# Patient Record
Sex: Male | Born: 1985
Health system: Southern US, Community
[De-identification: ages and names within clinical notes are randomized; demographics above are authoritative.]

---
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
## 2003-08-10 ENCOUNTER — Emergency Department (HOSPITAL_COMMUNITY): Admission: AD | Admit: 2003-08-10 | Discharge: 2003-08-10 | Payer: Self-pay | Admitting: Emergency Medicine

## 2004-07-25 ENCOUNTER — Emergency Department (HOSPITAL_COMMUNITY): Admission: EM | Admit: 2004-07-25 | Discharge: 2004-07-25 | Payer: Self-pay | Admitting: Emergency Medicine

## 2008-03-05 ENCOUNTER — Emergency Department (HOSPITAL_COMMUNITY): Admission: EM | Admit: 2008-03-05 | Discharge: 2008-03-05 | Payer: Self-pay | Admitting: Emergency Medicine

## 2012-09-06 ENCOUNTER — Emergency Department: Payer: Self-pay | Admitting: Emergency Medicine

## 2018-03-17 ENCOUNTER — Emergency Department (HOSPITAL_COMMUNITY): Payer: Worker's Compensation

## 2018-03-17 ENCOUNTER — Inpatient Hospital Stay (HOSPITAL_COMMUNITY): Payer: Worker's Compensation | Admitting: Anesthesiology

## 2018-03-17 ENCOUNTER — Encounter (HOSPITAL_COMMUNITY): Payer: Self-pay | Admitting: Emergency Medicine

## 2018-03-17 ENCOUNTER — Inpatient Hospital Stay (HOSPITAL_COMMUNITY)
Admission: EM | Admit: 2018-03-17 | Discharge: 2018-03-21 | DRG: 958 | Disposition: A | Discharge status: Home / Self Care | Payer: Worker's Compensation | Attending: General Surgery | Admitting: General Surgery

## 2018-03-17 ENCOUNTER — Other Ambulatory Visit: Payer: Self-pay

## 2018-03-17 ENCOUNTER — Encounter (HOSPITAL_COMMUNITY): Admission: EM | Disposition: A | Discharge status: Home / Self Care | Payer: Self-pay | Source: Home / Self Care

## 2018-03-17 DIAGNOSIS — D62 Acute posthemorrhagic anemia: Secondary | ICD-10-CM | POA: Diagnosis present

## 2018-03-17 DIAGNOSIS — S0181XA Laceration without foreign body of other part of head, initial encounter: Secondary | ICD-10-CM | POA: Diagnosis present

## 2018-03-17 DIAGNOSIS — S36113A Laceration of liver, unspecified degree, initial encounter: Secondary | ICD-10-CM | POA: Diagnosis present

## 2018-03-17 DIAGNOSIS — Z419 Encounter for procedure for purposes other than remedying health state, unspecified: Secondary | ICD-10-CM

## 2018-03-17 DIAGNOSIS — S92001B Unspecified fracture of right calcaneus, initial encounter for open fracture: Secondary | ICD-10-CM | POA: Diagnosis present

## 2018-03-17 DIAGNOSIS — Y92410 Unspecified street and highway as the place of occurrence of the external cause: Secondary | ICD-10-CM

## 2018-03-17 DIAGNOSIS — F17200 Nicotine dependence, unspecified, uncomplicated: Secondary | ICD-10-CM | POA: Diagnosis present

## 2018-03-17 DIAGNOSIS — T148XXA Other injury of unspecified body region, initial encounter: Secondary | ICD-10-CM

## 2018-03-17 DIAGNOSIS — S62231A Other displaced fracture of base of first metacarpal bone, right hand, initial encounter for closed fracture: Secondary | ICD-10-CM | POA: Diagnosis present

## 2018-03-17 DIAGNOSIS — R0602 Shortness of breath: Secondary | ICD-10-CM

## 2018-03-17 DIAGNOSIS — S71112A Laceration without foreign body, left thigh, initial encounter: Secondary | ICD-10-CM | POA: Diagnosis present

## 2018-03-17 DIAGNOSIS — J939 Pneumothorax, unspecified: Secondary | ICD-10-CM

## 2018-03-17 DIAGNOSIS — S270XXA Traumatic pneumothorax, initial encounter: Secondary | ICD-10-CM | POA: Diagnosis present

## 2018-03-17 DIAGNOSIS — S92001A Unspecified fracture of right calcaneus, initial encounter for closed fracture: Secondary | ICD-10-CM

## 2018-03-17 HISTORY — PX: I & D EXTREMITY: SHX5045

## 2018-03-17 LAB — CBC
HEMATOCRIT: 45 % (ref 39.0–52.0)
HEMATOCRIT: 36.4 % — AB (ref 39.0–52.0)
HEMOGLOBIN: 15 g/dL (ref 13.0–17.0)
Hemoglobin: 12.3 g/dL — ABNORMAL LOW (ref 13.0–17.0)
MCH: 32.9 pg (ref 26.0–34.0)
MCH: 32.8 pg (ref 26.0–34.0)
MCHC: 33.3 g/dL (ref 30.0–36.0)
MCHC: 33.8 g/dL (ref 30.0–36.0)
MCV: 98.7 fL (ref 80.0–100.0)
MCV: 97.1 fL (ref 80.0–100.0)
NRBC: 0 % (ref 0.0–0.2)
NRBC: 0 % (ref 0.0–0.2)
PLATELETS: 168 10*3/uL (ref 150–400)
Platelets: 295 10*3/uL (ref 150–400)
RBC: 4.56 MIL/uL (ref 4.22–5.81)
RBC: 3.75 MIL/uL — ABNORMAL LOW (ref 4.22–5.81)
RDW: 12.4 % (ref 11.5–15.5)
RDW: 12.6 % (ref 11.5–15.5)
WBC: 24.8 10*3/uL — ABNORMAL HIGH (ref 4.0–10.5)
WBC: 10.9 10*3/uL — AB (ref 4.0–10.5)

## 2018-03-17 LAB — COMPREHENSIVE METABOLIC PANEL
ALBUMIN: 3.6 g/dL (ref 3.5–5.0)
ALK PHOS: 70 U/L (ref 38–126)
ALT: 173 U/L — ABNORMAL HIGH (ref 0–44)
ANION GAP: 10 (ref 5–15)
AST: 335 U/L — AB (ref 15–41)
BUN: 9 mg/dL (ref 6–20)
CO2: 20 mmol/L — ABNORMAL LOW (ref 22–32)
Calcium: 8.4 mg/dL — ABNORMAL LOW (ref 8.9–10.3)
Chloride: 109 mmol/L (ref 98–111)
Creatinine, Ser: 1.18 mg/dL (ref 0.61–1.24)
GFR calc Af Amer: >60 mL/min (ref >60)
GFR calc non Af Amer: >60 mL/min (ref >60)
GLUCOSE: 142 mg/dL — AB (ref 70–99)
POTASSIUM: 4.1 mmol/L (ref 3.5–5.1)
SODIUM: 139 mmol/L (ref 135–145)
Total Bilirubin: 0.9 mg/dL (ref 0.3–1.2)
Total Protein: 6.6 g/dL (ref 6.5–8.1)

## 2018-03-17 LAB — I-STAT CG4 LACTIC ACID, ED: Lactic Acid, Venous: 3.6 mmol/L (ref 0.5–1.9)

## 2018-03-17 LAB — ETHANOL

## 2018-03-17 LAB — CDS SEROLOGY

## 2018-03-17 LAB — PROTIME-INR
INR: 0.82
PROTHROMBIN TIME: 11.2 s — AB (ref 11.4–15.2)

## 2018-03-17 LAB — CBG MONITORING, ED: Glucose-Capillary: 135 mg/dL — ABNORMAL HIGH (ref 70–99)

## 2018-03-17 SURGERY — IRRIGATION AND DEBRIDEMENT EXTREMITY
Anesthesia: General | Site: Foot | Laterality: Right

## 2018-03-17 MED ORDER — SODIUM CHLORIDE 0.9 % IR SOLN
Status: DC | PRN
  Administered 2018-03-17: 6000 mL

## 2018-03-17 MED ORDER — FENTANYL CITRATE (PF) 100 MCG/2ML IJ SOLN
INTRAMUSCULAR | Status: AC | PRN
  Administered 2018-03-17: 100 ug via INTRAVENOUS

## 2018-03-17 MED ORDER — PROPOFOL 10 MG/ML IV BOLUS
INTRAVENOUS | Status: AC
  Filled 2018-03-17: qty 20

## 2018-03-17 MED ORDER — CEFAZOLIN SODIUM-DEXTROSE 2-4 GM/100ML-% IV SOLN
2.0000 g | Freq: Once | INTRAVENOUS | Status: AC
  Administered 2018-03-17: 2 g via INTRAVENOUS
  Filled 2018-03-17: qty 100

## 2018-03-17 MED ORDER — HYDRALAZINE HCL 20 MG/ML IJ SOLN: 10.0000 mg | INTRAMUSCULAR | Status: DC | PRN

## 2018-03-17 MED ORDER — FENTANYL CITRATE (PF) 250 MCG/5ML IJ SOLN
INTRAMUSCULAR | Status: AC
  Filled 2018-03-17: qty 5

## 2018-03-17 MED ORDER — MEPERIDINE HCL 50 MG/ML IJ SOLN: 6.2500 mg | INTRAMUSCULAR | Status: DC | PRN

## 2018-03-17 MED ORDER — IOHEXOL 300 MG/ML  SOLN
100.0000 mL | Freq: Once | INTRAMUSCULAR | Status: AC | PRN
  Administered 2018-03-17: 100 mL via INTRAVENOUS

## 2018-03-17 MED ORDER — LIDOCAINE-EPINEPHRINE (PF) 2 %-1:200000 IJ SOLN
20.0000 mL | Freq: Once | INTRAMUSCULAR | Status: DC
  Filled 2018-03-17: qty 20

## 2018-03-17 MED ORDER — LACTATED RINGERS IV SOLN: INTRAVENOUS | Status: DC

## 2018-03-17 MED ORDER — ONDANSETRON HCL 4 MG PO TABS: 4.0000 mg | ORAL_TABLET | Freq: Four times a day (QID) | ORAL | Status: DC | PRN

## 2018-03-17 MED ORDER — CEFAZOLIN SODIUM-DEXTROSE 1-4 GM/50ML-% IV SOLN
1.0000 g | Freq: Four times a day (QID) | INTRAVENOUS | Status: AC
  Administered 2018-03-17 – 2018-03-18 (×3): 1 g via INTRAVENOUS
  Filled 2018-03-17 (×3): qty 50

## 2018-03-17 MED ORDER — ACETAMINOPHEN 325 MG PO TABS
650.0000 mg | ORAL_TABLET | Freq: Four times a day (QID) | ORAL | Status: DC
  Administered 2018-03-17 – 2018-03-21 (×14): 650 mg via ORAL
  Filled 2018-03-17 (×14): qty 2

## 2018-03-17 MED ORDER — LIDOCAINE 2% (20 MG/ML) 5 ML SYRINGE
INTRAMUSCULAR | Status: DC | PRN
  Administered 2018-03-17: 100 mg via INTRAVENOUS

## 2018-03-17 MED ORDER — SODIUM CHLORIDE 0.9 % IV SOLN
INTRAVENOUS | Status: DC
  Administered 2018-03-17 – 2018-03-21 (×9): via INTRAVENOUS

## 2018-03-17 MED ORDER — HYDROMORPHONE HCL 1 MG/ML IJ SOLN
0.2500 mg | INTRAMUSCULAR | Status: DC | PRN
  Administered 2018-03-17 (×2): 0.25 mg via INTRAVENOUS
  Administered 2018-03-17: 0.5 mg via INTRAVENOUS

## 2018-03-17 MED ORDER — METOCLOPRAMIDE HCL 5 MG/ML IJ SOLN: 5.0000 mg | Freq: Three times a day (TID) | INTRAMUSCULAR | Status: DC | PRN

## 2018-03-17 MED ORDER — ONDANSETRON HCL 4 MG/2ML IJ SOLN
4.0000 mg | Freq: Four times a day (QID) | INTRAMUSCULAR | Status: DC | PRN
  Administered 2018-03-17 – 2018-03-19 (×3): 4 mg via INTRAVENOUS
  Filled 2018-03-17 (×2): qty 2

## 2018-03-17 MED ORDER — SUCCINYLCHOLINE CHLORIDE 20 MG/ML IJ SOLN
INTRAMUSCULAR | Status: DC | PRN
  Administered 2018-03-17: 120 mg via INTRAVENOUS

## 2018-03-17 MED ORDER — FENTANYL CITRATE (PF) 100 MCG/2ML IJ SOLN
INTRAMUSCULAR | Status: AC
  Filled 2018-03-17: qty 2

## 2018-03-17 MED ORDER — HYDROMORPHONE HCL 1 MG/ML IJ SOLN
1.0000 mg | Freq: Once | INTRAMUSCULAR | Status: AC
  Administered 2018-03-17: 1 mg via INTRAVENOUS
  Filled 2018-03-17: qty 1

## 2018-03-17 MED ORDER — TETANUS-DIPHTH-ACELL PERTUSSIS 5-2.5-18.5 LF-MCG/0.5 IM SUSP
0.5000 mL | Freq: Once | INTRAMUSCULAR | Status: AC
  Administered 2018-03-17: 0.5 mL via INTRAMUSCULAR
  Filled 2018-03-17: qty 0.5

## 2018-03-17 MED ORDER — LIDOCAINE-EPINEPHRINE (PF) 1 %-1:200000 IJ SOLN
20.0000 mL | Freq: Once | INTRAMUSCULAR | Status: AC
  Administered 2018-03-17: 20 mL via INTRADERMAL

## 2018-03-17 MED ORDER — DIPHENHYDRAMINE HCL 50 MG/ML IJ SOLN
INTRAMUSCULAR | Status: DC | PRN
  Administered 2018-03-17: 12.5 mg via INTRAVENOUS

## 2018-03-17 MED ORDER — CEFAZOLIN SODIUM-DEXTROSE 2-3 GM-%(50ML) IV SOLR
INTRAVENOUS | Status: DC | PRN
  Administered 2018-03-17: 2 g via INTRAVENOUS

## 2018-03-17 MED ORDER — MIDAZOLAM HCL 2 MG/2ML IJ SOLN
INTRAMUSCULAR | Status: AC
  Filled 2018-03-17: qty 2

## 2018-03-17 MED ORDER — NICOTINE 14 MG/24HR TD PT24
14.0000 mg | MEDICATED_PATCH | Freq: Every day | TRANSDERMAL | Status: DC
  Administered 2018-03-17 – 2018-03-21 (×5): 14 mg via TRANSDERMAL
  Filled 2018-03-17 (×5): qty 1

## 2018-03-17 MED ORDER — MIDAZOLAM HCL 5 MG/5ML IJ SOLN
INTRAMUSCULAR | Status: DC | PRN
  Administered 2018-03-17: 2 mg via INTRAVENOUS

## 2018-03-17 MED ORDER — DOCUSATE SODIUM 100 MG PO CAPS: 100.0000 mg | ORAL_CAPSULE | Freq: Two times a day (BID) | ORAL | Status: DC

## 2018-03-17 MED ORDER — PHENYLEPHRINE HCL 10 MG/ML IJ SOLN
INTRAMUSCULAR | Status: DC | PRN
  Administered 2018-03-17: 40 ug via INTRAVENOUS

## 2018-03-17 MED ORDER — LACTATED RINGERS IV SOLN
INTRAVENOUS | Status: DC | PRN
  Administered 2018-03-17 (×2): via INTRAVENOUS

## 2018-03-17 MED ORDER — ONDANSETRON HCL 4 MG/2ML IJ SOLN: 4.0000 mg | Freq: Four times a day (QID) | INTRAMUSCULAR | Status: DC | PRN

## 2018-03-17 MED ORDER — PROMETHAZINE HCL 25 MG/ML IJ SOLN: 6.2500 mg | INTRAMUSCULAR | Status: DC | PRN

## 2018-03-17 MED ORDER — OXYCODONE HCL 5 MG PO TABS
10.0000 mg | ORAL_TABLET | ORAL | Status: DC | PRN
  Administered 2018-03-18 – 2018-03-21 (×13): 10 mg via ORAL
  Filled 2018-03-17 (×13): qty 2

## 2018-03-17 MED ORDER — DOCUSATE SODIUM 100 MG PO CAPS
100.0000 mg | ORAL_CAPSULE | Freq: Two times a day (BID) | ORAL | Status: DC
  Administered 2018-03-17 – 2018-03-21 (×6): 100 mg via ORAL
  Filled 2018-03-17 (×6): qty 1

## 2018-03-17 MED ORDER — FENTANYL CITRATE (PF) 250 MCG/5ML IJ SOLN
INTRAMUSCULAR | Status: DC | PRN
  Administered 2018-03-17: 100 ug via INTRAVENOUS
  Administered 2018-03-17: 50 ug via INTRAVENOUS
  Administered 2018-03-17: 100 ug via INTRAVENOUS

## 2018-03-17 MED ORDER — METOCLOPRAMIDE HCL 10 MG PO TABS: 5.0000 mg | ORAL_TABLET | Freq: Three times a day (TID) | ORAL | Status: DC | PRN

## 2018-03-17 MED ORDER — SODIUM CHLORIDE 0.9 % IV SOLN
INTRAVENOUS | Status: AC | PRN
  Administered 2018-03-17: 1000 mL via INTRAVENOUS

## 2018-03-17 MED ORDER — MORPHINE SULFATE (PF) 2 MG/ML IV SOLN
2.0000 mg | INTRAVENOUS | Status: DC | PRN
  Administered 2018-03-17: 2 mg via INTRAVENOUS
  Administered 2018-03-17 – 2018-03-18 (×5): 4 mg via INTRAVENOUS
  Administered 2018-03-20: 2 mg via INTRAVENOUS
  Filled 2018-03-17 (×4): qty 2
  Filled 2018-03-17 (×2): qty 1
  Filled 2018-03-17: qty 2

## 2018-03-17 MED ORDER — HYDROMORPHONE HCL 1 MG/ML IJ SOLN
INTRAMUSCULAR | Status: AC
  Administered 2018-03-17: 0.5 mg via INTRAVENOUS
  Filled 2018-03-17: qty 1

## 2018-03-17 MED ORDER — FAMOTIDINE 20 MG PO TABS
20.0000 mg | ORAL_TABLET | Freq: Every day | ORAL | Status: DC
  Administered 2018-03-18 – 2018-03-21 (×3): 20 mg via ORAL
  Filled 2018-03-17 (×3): qty 1

## 2018-03-17 MED ORDER — ONDANSETRON HCL 4 MG/2ML IJ SOLN
INTRAMUSCULAR | Status: DC | PRN
  Administered 2018-03-17: 4 mg via INTRAVENOUS

## 2018-03-17 MED ORDER — ONDANSETRON 4 MG PO TBDP: 4.0000 mg | ORAL_TABLET | Freq: Four times a day (QID) | ORAL | Status: DC | PRN

## 2018-03-17 MED ORDER — LIDOCAINE-EPINEPHRINE (PF) 2 %-1:200000 IJ SOLN
20.0000 mL | Freq: Once | INTRAMUSCULAR | Status: AC
  Administered 2018-03-17: 20 mL via INTRADERMAL
  Filled 2018-03-17: qty 20

## 2018-03-17 MED ORDER — PROPOFOL 10 MG/ML IV BOLUS
INTRAVENOUS | Status: DC | PRN
  Administered 2018-03-17: 160 mg via INTRAVENOUS

## 2018-03-17 SURGICAL SUPPLY — 71 items
ALCOHOL 70% 16 OZ (MISCELLANEOUS) ×3 IMPLANT
BANDAGE ACE 3X5.8 VEL STRL LF (GAUZE/BANDAGES/DRESSINGS) IMPLANT
BANDAGE ELASTIC 4 VELCRO ST LF (GAUZE/BANDAGES/DRESSINGS) ×2 IMPLANT
BANDAGE ELASTIC 6 VELCRO ST LF (GAUZE/BANDAGES/DRESSINGS) ×2 IMPLANT
BLADE SURG 10 STRL SS (BLADE) ×3 IMPLANT
BNDG COHESIVE 1X5 TAN STRL LF (GAUZE/BANDAGES/DRESSINGS) IMPLANT
BNDG COHESIVE 4X5 TAN STRL (GAUZE/BANDAGES/DRESSINGS) ×3 IMPLANT
BNDG COHESIVE 6X5 TAN STRL LF (GAUZE/BANDAGES/DRESSINGS) ×6 IMPLANT
BNDG CONFORM 3 STRL LF (GAUZE/BANDAGES/DRESSINGS) IMPLANT
BNDG GAUZE STRTCH 6 (GAUZE/BANDAGES/DRESSINGS) ×9 IMPLANT
CORDS BIPOLAR (ELECTRODE) IMPLANT
COVER SURGICAL LIGHT HANDLE (MISCELLANEOUS) ×3 IMPLANT
COVER WAND RF STERILE (DRAPES) ×3 IMPLANT
CUFF TOURNIQUET SINGLE 24IN (TOURNIQUET CUFF) IMPLANT
CUFF TOURNIQUET SINGLE 34IN LL (TOURNIQUET CUFF) ×6 IMPLANT
CUFF TOURNIQUET SINGLE 44IN (TOURNIQUET CUFF) IMPLANT
DRAPE EXTREMITY BILATERAL (DRAPES) IMPLANT
DRAPE IMP U-DRAPE 54X76 (DRAPES) IMPLANT
DRAPE INCISE IOBAN 66X45 STRL (DRAPES) ×12 IMPLANT
DRAPE SURG 17X23 STRL (DRAPES) IMPLANT
DRAPE U-SHAPE 47X51 STRL (DRAPES) ×3 IMPLANT
DURAPREP 26ML APPLICATOR (WOUND CARE) ×3 IMPLANT
ELECT CAUTERY BLADE 6.4 (BLADE) ×3 IMPLANT
ELECT REM PT RETURN 9FT ADLT (ELECTROSURGICAL)
ELECTRODE REM PT RTRN 9FT ADLT (ELECTROSURGICAL) IMPLANT
FACESHIELD WRAPAROUND (MASK) IMPLANT
FACESHIELD WRAPAROUND OR TEAM (MASK) IMPLANT
GAUZE SPONGE 4X4 12PLY STRL (GAUZE/BANDAGES/DRESSINGS) ×6 IMPLANT
GAUZE XEROFORM 1X8 LF (GAUZE/BANDAGES/DRESSINGS) ×3 IMPLANT
GAUZE XEROFORM 5X9 LF (GAUZE/BANDAGES/DRESSINGS) ×3 IMPLANT
GLOVE BIO SURGEON STRL SZ7.5 (GLOVE) ×3 IMPLANT
GLOVE BIOGEL PI IND STRL 8 (GLOVE) ×1 IMPLANT
GLOVE BIOGEL PI INDICATOR 8 (GLOVE) ×2
GOWN STRL REUS W/ TWL LRG LVL3 (GOWN DISPOSABLE) ×2 IMPLANT
GOWN STRL REUS W/ TWL XL LVL3 (GOWN DISPOSABLE) ×1 IMPLANT
GOWN STRL REUS W/TWL LRG LVL3 (GOWN DISPOSABLE) ×6
GOWN STRL REUS W/TWL XL LVL3 (GOWN DISPOSABLE) ×3
HANDPIECE INTERPULSE COAX TIP (DISPOSABLE)
KIT BASIN OR (CUSTOM PROCEDURE TRAY) ×3 IMPLANT
KIT TURNOVER KIT B (KITS) ×3 IMPLANT
MANIFOLD NEPTUNE II (INSTRUMENTS) ×3 IMPLANT
NS IRRIG 1000ML POUR BTL (IV SOLUTION) ×6 IMPLANT
PACK ORTHO EXTREMITY (CUSTOM PROCEDURE TRAY) ×3 IMPLANT
PAD ABD 8X10 STRL (GAUZE/BANDAGES/DRESSINGS) ×3 IMPLANT
PAD ARMBOARD 7.5X6 YLW CONV (MISCELLANEOUS) ×6 IMPLANT
PADDING CAST ABS 4INX4YD NS (CAST SUPPLIES) ×4
PADDING CAST ABS 6INX4YD NS (CAST SUPPLIES) ×2
PADDING CAST ABS COTTON 4X4 ST (CAST SUPPLIES) ×2 IMPLANT
PADDING CAST ABS COTTON 6X4 NS (CAST SUPPLIES) IMPLANT
PADDING CAST COTTON 6X4 STRL (CAST SUPPLIES) ×3 IMPLANT
SET HNDPC FAN SPRY TIP SCT (DISPOSABLE) IMPLANT
SPLINT PLASTER CAST XFAST 5X30 (CAST SUPPLIES) IMPLANT
SPLINT PLASTER XFAST SET 5X30 (CAST SUPPLIES) ×2
SPONGE LAP 18X18 X RAY DECT (DISPOSABLE) ×6 IMPLANT
STOCKINETTE IMPERVIOUS 9X36 MD (GAUZE/BANDAGES/DRESSINGS) ×3 IMPLANT
SUT ETHILON 2 0 FS 18 (SUTURE) IMPLANT
SUT ETHILON 2 0 PSLX (SUTURE) IMPLANT
SUT ETHILON 3 0 PS 1 (SUTURE) IMPLANT
SUT VIC AB 2-0 CT1 36 (SUTURE) IMPLANT
SUT VIC AB 2-0 FS1 27 (SUTURE) IMPLANT
SWAB CULTURE ESWAB REG 1ML (MISCELLANEOUS) IMPLANT
SYR CONTROL 10ML LL (SYRINGE) IMPLANT
TOWEL OR 17X24 6PK STRL BLUE (TOWEL DISPOSABLE) ×3 IMPLANT
TOWEL OR 17X26 10 PK STRL BLUE (TOWEL DISPOSABLE) ×3 IMPLANT
TUBE CONNECTING 12'X1/4 (SUCTIONS) ×1
TUBE CONNECTING 12X1/4 (SUCTIONS) ×2 IMPLANT
TUBE FEEDING ENTERAL 5FR 16IN (TUBING) IMPLANT
TUBING CYSTO DISP (UROLOGICAL SUPPLIES) ×3 IMPLANT
UNDERPAD 30X30 (UNDERPADS AND DIAPERS) ×6 IMPLANT
WATER STERILE IRR 1000ML POUR (IV SOLUTION) ×3 IMPLANT
YANKAUER SUCT BULB TIP NO VENT (SUCTIONS) ×3 IMPLANT

## 2018-03-17 NOTE — Brief Op Note (Signed)
03/17/2018  7:42 PM  PATIENT:  Dustin [name]  32 y.o. male  PRE-OPERATIVE DIAGNOSIS:  Open right calc fx  POST-OPERATIVE DIAGNOSIS:  Open right calc fx  PROCEDURE:  Procedure(s): IRRIGATION AND DEBRIDEMENT EXTREMITY (Right)  SURGEON:  Surgeon(s) and Role:    * Yolonda Kida, MD - Primary  PHYSICIAN ASSISTANT:   ASSISTANTS: none   ANESTHESIA:   general  EBL:  10 cc  BLOOD ADMINISTERED:none  DRAINS: none   LOCAL MEDICATIONS USED:  NONE  SPECIMEN:  No Specimen  DISPOSITION OF SPECIMEN:  N/A  COUNTS:  YES  TOURNIQUET:  * No tourniquets in log *  DICTATION: .Note written in EPIC  PLAN OF CARE: Admit to inpatient   PATIENT DISPOSITION:  PACU - hemodynamically stable.   Delay start of Pharmacological VTE agent (>24hrs) due to surgical blood loss or risk of bleeding: not applicable
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
## 2018-03-17 NOTE — ED Notes (Signed)
Attempted report x1. 

## 2018-03-17 NOTE — ED Notes (Signed)
Patient transported to CT 
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
## 2018-03-17 NOTE — ED Notes (Signed)
Wife at bedside, updated on plan of care.
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
## 2018-03-17 NOTE — Anesthesia Postprocedure Evaluation (Signed)
Anesthesia Post Note  Patient: Betsy Coder  Procedure(s) Performed: IRRIGATION AND DEBRIDEMENT EXTREMITY (Right Foot)     Patient location during evaluation: PACU Anesthesia Type: General Level of consciousness: awake Pain management: pain level controlled Vital Signs Assessment: post-procedure vital signs reviewed and stable Respiratory status: spontaneous breathing Cardiovascular status: stable Postop Assessment: no apparent nausea or vomiting Anesthetic complications: no    Last Vitals:  Vitals:   03/17/18 2040 03/17/18 2110  BP: 135/83 (!) 147/92  Pulse: 99 98  Resp: 19   Temp: 36.6 C 37.3 C  SpO2: 92% 94%    Last Pain:  Vitals:   03/17/18 2218  TempSrc:   PainSc: 9     LLE Motor Response: Purposeful movement;Responds to commands (03/17/18 2249) LLE Sensation: Full sensation (03/17/18 2249) RLE Motor Response: Purposeful movement;Responds to commands (03/17/18 2249) RLE Sensation: Full sensation;Pain (03/17/18 2249)      Dustin [name]

## 2018-03-17 NOTE — ED Notes (Signed)
RN noticed 3-4in lac on back of pts left thigh. Dr. Patria Mane notified.

## 2018-03-17 NOTE — Progress Notes (Signed)
Orthopedic Tech Progress Note Patient Details:  Dustin [name] 06-06-1985 [id number]  Ortho Devices Type of Ortho Device: Ace wrap, Thumb spica splint Splint Material: Plaster Ortho Device/Splint Location: rue Ortho Device/Splint Interventions: Application   Post Interventions Patient Tolerated: Well Instructions Provided: Care of device   Nikki Dom 03/17/2018, 1:31 PM

## 2018-03-17 NOTE — Op Note (Signed)
Date of Surgery: 03/17/2018  INDICATIONS: Dustin [name] is a 31 y.o.-year-old male with a right open calcaneus fracture;  The patient did consent to the procedure after discussion of the risks and benefits.  PREOPERATIVE DIAGNOSIS: Right type I open calcaneus fracture  POSTOPERATIVE DIAGNOSIS: Same.  PROCEDURE: Irrigation and excisional debridement for right type I open calcaneus fracture including bone, skin, subcutaneous tissue, and muscle.  SURGEON: Maryan Rued, M.D.  ASSIST: None.  ANESTHESIA:  general  IV FLUIDS AND URINE: See anesthesia.  ESTIMATED BLOOD LOSS: 10 mL.  IMPLANTS: None  DRAINS: None  COMPLICATIONS: None.  DESCRIPTION OF PROCEDURE: The patient was brought to the operating room and placed supine on the operating table.  The patient had been signed prior to the procedure and this was documented. The patient had the anesthesia placed by the anesthesiologist.  A time-out was performed to confirm that this was the correct patient, site, side and location. The patient did receive antibiotics prior to the incision and was re-dosed during the procedure as needed at indicated intervals.  A tourniquet was not placed.  The patient had the operative extremity prepped and draped in the standard surgical fashion.      There was a 1.5 cm opening noted just distal and posterior to the distal tip of the fibula.  This communicated with the calcaneus fracture.  I extended this in line with the long axis of the foot distal fashion approximately 5 cm.  We identified the sural nerve and the wound and protected this throughout the case.  We then dissected down through subcutaneous tissue to the comminuted fracture.  We then excisionally debrided skin, subcutaneous tissue, muscle, and bone sharply using a knife and rondure.  Following the excisional debridement we then copiously irrigated wound with 6 L of normal saline.  The wound was then closed primarily utilizing horizontal mattress 2-0  nylon sutures.  A standard sterile dressing was applied.  The leg was cleaned and dried one final time.  All counts were correct x2.  We then placed the right lower extremity in a well-padded short leg posterior splint with a stirrup.  Patient was transported to PACU in stable condition.  POSTOPERATIVE PLAN:  Dustin [name] will be admitted to the trauma service.  He will remain nonweightbearing to the right lower extremity with strict elevation.  He will ultimately need definitive fixation of both the calcaneus and the right thumb.  The orthopedic trauma service will be reviewing his films and likely proceed with definitive fixation when they have availability.  They will be seeing the patient in consultation tomorrow.  Okay to begin chemical DVT prophylaxis as soon as possible.

## 2018-03-17 NOTE — Anesthesia Procedure Notes (Signed)
Procedure Name: Intubation Date/Time: 03/17/2018 7:06 PM Performed by: Claudina Lick, CRNA Pre-anesthesia Checklist: Patient identified, Emergency Drugs available, Suction available, Patient being monitored and Timeout performed Patient Re-evaluated:Patient Re-evaluated prior to induction Oxygen Delivery Method: Circle system utilized Preoxygenation: Pre-oxygenation with 100% oxygen Induction Type: IV induction, Rapid sequence and Cricoid Pressure applied Laryngoscope Size: Miller and 2 Grade View: Grade I Tube size: 7.5 mm Number of attempts: 1 Airway Equipment and Method: Stylet Placement Confirmation: ETT inserted through vocal cords under direct vision,  positive ETCO2 and breath sounds checked- equal and bilateral Secured at: 22 cm Tube secured with: Tape Dental Injury: Teeth and Oropharynx as per pre-operative assessment

## 2018-03-17 NOTE — Transfer of Care (Signed)
Immediate Anesthesia Transfer of Care Note  Patient: Dustin [name]  Procedure(s) Performed: IRRIGATION AND DEBRIDEMENT EXTREMITY (Right Foot)  Patient Location: PACU  Anesthesia Type:General  Level of Consciousness: drowsy  Airway & Oxygen Therapy: Patient Spontanous Breathing and Patient connected to face mask oxygen  Post-op Assessment: Report given to RN and Post -op Vital signs reviewed and stable  Post vital signs: Reviewed and stable  Last Vitals:  Vitals Value Taken Time  BP 130/88 03/17/2018  7:46 PM  Temp 36.5 C 03/17/2018  7:46 PM  Pulse 93 03/17/2018  7:48 PM  Resp 25 03/17/2018  7:48 PM  SpO2 93 % 03/17/2018  7:48 PM  Vitals shown include unvalidated device data.  Last Pain:  Vitals:   03/17/18 1525  TempSrc:   PainSc: 8       Patients Stated Pain Goal: 0 (03/17/18 1302)  Complications: No apparent anesthesia complications

## 2018-03-17 NOTE — Procedures (Signed)
SUBJECTIVE:  32 y.o. male sustained laceration of left chin just inferior to lip.   OBJECTIVE:  Patient appears well, vitals are normal. Laceration 4 cm noted.  Description: clean wound edges, no foreign bodies. Neurovascular and tendon structures are intact.  ASSESSMENT:  Laceration as described.  Procedure: laceration repair  Beard around wound was shaved. Sterile technique was maintained throughout entire procedure. Wound cleaned with betadine and covered with sterile drape. Anesthesia with 1% Lidocaine with Epinephrine was injected into wound. Wound cleansed, debrided of visible foreign material and sutured. 5.0 prolene was used and 4 sutures were placed. Dressing applied.  Wound care instructions provided.  Pt tolerated procedure well. Minimal blood loss.  Mattie Marlin, [facility] Surgery Pager [phone]

## 2018-03-17 NOTE — ED Notes (Signed)
Gave results to campos md

## 2018-03-17 NOTE — Anesthesia Preprocedure Evaluation (Addendum)
Anesthesia Evaluation  Patient identified by MRN, date of birth, ID band Patient awake    Reviewed: Allergy & Precautions, NPO status , Patient's Chart, lab work & pertinent test results  Airway Mallampati: II  TM Distance: >3 FB Neck ROM: Full    Dental no notable dental hx.    Pulmonary Current Smoker,    Pulmonary exam normal breath sounds clear to auscultation       Cardiovascular negative cardio ROS Normal cardiovascular exam Rhythm:Regular Rate:Normal     Neuro/Psych negative neurological ROS  negative psych ROS   GI/Hepatic negative GI ROS, Neg liver ROS,   Endo/Other  negative endocrine ROS  Renal/GU negative Renal ROS     Musculoskeletal negative musculoskeletal ROS (+)   Abdominal   Peds  Hematology negative hematology ROS (+)   Anesthesia Other Findings   Reproductive/Obstetrics                             Anesthesia Physical Anesthesia Plan  ASA: II  Anesthesia Plan: General   Post-op Pain Management:    Induction: Intravenous  PONV Risk Score and Plan: 2 and Ondansetron, Dexamethasone and Midazolam  Airway Management Planned: Oral ETT  Additional Equipment:   Intra-op Plan:   Post-operative Plan: Extubation in OR  Informed Consent: I have reviewed the patients History and Physical, chart, labs and discussed the procedure including the risks, benefits and alternatives for the proposed anesthesia with the patient or authorized representative who has indicated his/her understanding and acceptance.   Dental advisory given  Plan Discussed with: CRNA  Anesthesia Plan Comments:        Anesthesia Quick Evaluation
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
## 2018-03-18 ENCOUNTER — Encounter (HOSPITAL_COMMUNITY): Payer: Self-pay | Admitting: Orthopedic Surgery

## 2018-03-18 ENCOUNTER — Inpatient Hospital Stay (HOSPITAL_COMMUNITY): Payer: Worker's Compensation

## 2018-03-18 LAB — RAPID URINE DRUG SCREEN, HOSP PERFORMED
AMPHETAMINES: NOT DETECTED
BENZODIAZEPINES: POSITIVE — AB
Barbiturates: NOT DETECTED
Cocaine: NOT DETECTED
OPIATES: POSITIVE — AB
Tetrahydrocannabinol: NOT DETECTED

## 2018-03-18 LAB — CBC
HCT: 37.1 % — ABNORMAL LOW (ref 39.0–52.0)
Hemoglobin: 11.9 g/dL — ABNORMAL LOW (ref 13.0–17.0)
MCH: 31.9 pg (ref 26.0–34.0)
MCHC: 32.1 g/dL (ref 30.0–36.0)
MCV: 99.5 fL (ref 80.0–100.0)
NRBC: 0 % (ref 0.0–0.2)
PLATELETS: 157 10*3/uL (ref 150–400)
RBC: 3.73 MIL/uL — ABNORMAL LOW (ref 4.22–5.81)
RDW: 12.8 % (ref 11.5–15.5)
WBC: 11.3 10*3/uL — AB (ref 4.0–10.5)

## 2018-03-18 LAB — MRSA PCR SCREENING: MRSA by PCR: NEGATIVE

## 2018-03-18 LAB — HIV ANTIBODY (ROUTINE TESTING W REFLEX): HIV Screen 4th Generation wRfx: NONREACTIVE

## 2018-03-18 MED ORDER — ENOXAPARIN SODIUM 40 MG/0.4ML ~~LOC~~ SOLN
40.0000 mg | SUBCUTANEOUS | Status: DC
  Administered 2018-03-18 – 2018-03-21 (×3): 40 mg via SUBCUTANEOUS
  Filled 2018-03-18 (×3): qty 0.4
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
## 2018-03-18 NOTE — Anesthesia Preprocedure Evaluation (Addendum)
Anesthesia Evaluation  Patient identified by MRN, date of birth, ID band Patient awake    Reviewed: Allergy & Precautions, H&P , NPO status , Patient's Chart, lab work & pertinent test results  Airway Mallampati: II  TM Distance: >3 FB Neck ROM: Full    Dental no notable dental hx. (+) Poor Dentition, Missing, Loose,    Pulmonary neg pulmonary ROS, Current Smoker,    Pulmonary exam normal breath sounds clear to auscultation + decreased breath sounds      Cardiovascular Exercise Tolerance: Good negative cardio ROS Normal cardiovascular exam Rhythm:Regular Rate:Normal     Neuro/Psych negative neurological ROS  negative psych ROS   GI/Hepatic negative GI ROS, Neg liver ROS,   Endo/Other  negative endocrine ROS  Renal/GU negative Renal ROS  negative genitourinary   Musculoskeletal Fx right 1st metacarpal Fx right calcaneus   Abdominal   Peds  Hematology negative hematology ROS (+)   Anesthesia Other Findings   Reproductive/Obstetrics negative OB ROS                        Anesthesia Physical Anesthesia Plan  ASA: II  Anesthesia Plan: General   Post-op Pain Management:    Induction: Intravenous  PONV Risk Score and Plan: 2 and Ondansetron, Dexamethasone and Midazolam  Airway Management Planned: Oral ETT  Additional Equipment:   Intra-op Plan:   Post-operative Plan: Extubation in OR  Informed Consent: I have reviewed the patients History and Physical, chart, labs and discussed the procedure including the risks, benefits and alternatives for the proposed anesthesia with the patient or authorized representative who has indicated his/her understanding and acceptance.   Dental advisory given  Plan Discussed with: CRNA and Surgeon  Anesthesia Plan Comments:        Anesthesia Quick Evaluation
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
## 2018-03-19 ENCOUNTER — Encounter (HOSPITAL_COMMUNITY): Admission: EM | Disposition: A | Discharge status: Home / Self Care | Payer: Self-pay | Source: Home / Self Care

## 2018-03-19 ENCOUNTER — Inpatient Hospital Stay (HOSPITAL_COMMUNITY): Payer: Worker's Compensation

## 2018-03-19 ENCOUNTER — Inpatient Hospital Stay (HOSPITAL_COMMUNITY): Payer: Worker's Compensation | Admitting: Anesthesiology

## 2018-03-19 ENCOUNTER — Encounter (HOSPITAL_COMMUNITY): Payer: Self-pay | Admitting: Anesthesiology

## 2018-03-19 HISTORY — PX: CHEST TUBE INSERTION: SHX231

## 2018-03-19 HISTORY — PX: I & D EXTREMITY: SHX5045

## 2018-03-19 HISTORY — PX: OPEN REDUCTION INTERNAL FIXATION (ORIF) METACARPAL: SHX6234

## 2018-03-19 LAB — BASIC METABOLIC PANEL
ANION GAP: 7 (ref 5–15)
BUN: <5 mg/dL — ABNORMAL LOW (ref 6–20)
CO2: 23 mmol/L (ref 22–32)
Calcium: 7.9 mg/dL — ABNORMAL LOW (ref 8.9–10.3)
Chloride: 107 mmol/L (ref 98–111)
Creatinine, Ser: 0.98 mg/dL (ref 0.61–1.24)
GFR calc Af Amer: >60 mL/min (ref >60)
GLUCOSE: 98 mg/dL (ref 70–99)
POTASSIUM: 3.7 mmol/L (ref 3.5–5.1)
Sodium: 137 mmol/L (ref 135–145)

## 2018-03-19 LAB — CBC
HEMATOCRIT: 32.3 % — AB (ref 39.0–52.0)
Hemoglobin: 10.6 g/dL — ABNORMAL LOW (ref 13.0–17.0)
MCH: 32.6 pg (ref 26.0–34.0)
MCHC: 32.8 g/dL (ref 30.0–36.0)
MCV: 99.4 fL (ref 80.0–100.0)
NRBC: 0 % (ref 0.0–0.2)
PLATELETS: 154 10*3/uL (ref 150–400)
RBC: 3.25 MIL/uL — AB (ref 4.22–5.81)
RDW: 12.3 % (ref 11.5–15.5)
WBC: 14.4 10*3/uL — ABNORMAL HIGH (ref 4.0–10.5)

## 2018-03-19 SURGERY — OPEN REDUCTION INTERNAL FIXATION (ORIF) METACARPAL
Anesthesia: General | Site: Hand | Laterality: Right

## 2018-03-19 MED ORDER — HYDROMORPHONE HCL 1 MG/ML IJ SOLN
INTRAMUSCULAR | Status: AC
  Administered 2018-03-19: 0.5 mg via INTRAVENOUS
  Filled 2018-03-19: qty 1

## 2018-03-19 MED ORDER — POLYETHYLENE GLYCOL 3350 17 G PO PACK
17.0000 g | PACK | Freq: Every day | ORAL | Status: DC
  Administered 2018-03-19 – 2018-03-21 (×3): 17 g via ORAL
  Filled 2018-03-19 (×3): qty 1

## 2018-03-19 MED ORDER — FENTANYL CITRATE (PF) 250 MCG/5ML IJ SOLN
INTRAMUSCULAR | Status: AC
  Filled 2018-03-19: qty 5

## 2018-03-19 MED ORDER — ALBUMIN HUMAN 5 % IV SOLN
INTRAVENOUS | Status: DC | PRN
  Administered 2018-03-19: 11:00:00 via INTRAVENOUS

## 2018-03-19 MED ORDER — CEFAZOLIN SODIUM-DEXTROSE 2-3 GM-%(50ML) IV SOLR
INTRAVENOUS | Status: DC | PRN
  Administered 2018-03-19: 2 g via INTRAVENOUS

## 2018-03-19 MED ORDER — ROCURONIUM BROMIDE 50 MG/5ML IV SOSY
PREFILLED_SYRINGE | INTRAVENOUS | Status: AC
  Filled 2018-03-19: qty 10

## 2018-03-19 MED ORDER — TOBRAMYCIN SULFATE 1.2 G IJ SOLR
INTRAMUSCULAR | Status: AC
  Filled 2018-03-19: qty 1.2

## 2018-03-19 MED ORDER — SUGAMMADEX SODIUM 200 MG/2ML IV SOLN
INTRAVENOUS | Status: DC | PRN
  Administered 2018-03-19: 160 mg via INTRAVENOUS

## 2018-03-19 MED ORDER — 0.9 % SODIUM CHLORIDE (POUR BTL) OPTIME
TOPICAL | Status: DC | PRN
  Administered 2018-03-19: 1000 mL

## 2018-03-19 MED ORDER — VANCOMYCIN HCL 1000 MG IV SOLR
INTRAVENOUS | Status: DC | PRN
  Administered 2018-03-19: 500 mg
  Administered 2018-03-19: 1000 mg

## 2018-03-19 MED ORDER — MEPERIDINE HCL 50 MG/ML IJ SOLN: 6.2500 mg | INTRAMUSCULAR | Status: DC | PRN

## 2018-03-19 MED ORDER — PROPOFOL 10 MG/ML IV BOLUS
INTRAVENOUS | Status: DC | PRN
  Administered 2018-03-19: 50 mg via INTRAVENOUS
  Administered 2018-03-19: 150 mg via INTRAVENOUS

## 2018-03-19 MED ORDER — ROCURONIUM BROMIDE 50 MG/5ML IV SOSY
PREFILLED_SYRINGE | INTRAVENOUS | Status: DC | PRN
  Administered 2018-03-19: 30 mg via INTRAVENOUS
  Administered 2018-03-19: 50 mg via INTRAVENOUS
  Administered 2018-03-19: 20 mg via INTRAVENOUS

## 2018-03-19 MED ORDER — LIDOCAINE 2% (20 MG/ML) 5 ML SYRINGE
INTRAMUSCULAR | Status: AC
  Filled 2018-03-19: qty 5

## 2018-03-19 MED ORDER — DEXAMETHASONE SODIUM PHOSPHATE 10 MG/ML IJ SOLN
INTRAMUSCULAR | Status: DC | PRN
  Administered 2018-03-19: 10 mg via INTRAVENOUS

## 2018-03-19 MED ORDER — LIDOCAINE HCL (PF) 1 % IJ SOLN
INTRAMUSCULAR | Status: AC
  Filled 2018-03-19: qty 60

## 2018-03-19 MED ORDER — BACITRACIN 500 UNIT/GM EX OINT
TOPICAL_OINTMENT | CUTANEOUS | Status: DC | PRN
  Administered 2018-03-19: 1 via TOPICAL

## 2018-03-19 MED ORDER — VANCOMYCIN HCL 500 MG IV SOLR
INTRAVENOUS | Status: AC
  Filled 2018-03-19: qty 500

## 2018-03-19 MED ORDER — PROPOFOL 10 MG/ML IV BOLUS
INTRAVENOUS | Status: AC
  Filled 2018-03-19: qty 40

## 2018-03-19 MED ORDER — LIDOCAINE 2% (20 MG/ML) 5 ML SYRINGE
INTRAMUSCULAR | Status: DC | PRN
  Administered 2018-03-19: 40 mg via INTRAVENOUS
  Administered 2018-03-19: 60 mg via INTRAVENOUS
  Administered 2018-03-19: 100 mg via INTRAVENOUS

## 2018-03-19 MED ORDER — MIDAZOLAM HCL 2 MG/2ML IJ SOLN
INTRAMUSCULAR | Status: AC
  Filled 2018-03-19: qty 2

## 2018-03-19 MED ORDER — ONDANSETRON HCL 4 MG/2ML IJ SOLN
INTRAMUSCULAR | Status: AC
  Filled 2018-03-19: qty 2

## 2018-03-19 MED ORDER — DEXAMETHASONE SODIUM PHOSPHATE 10 MG/ML IJ SOLN
INTRAMUSCULAR | Status: AC
  Filled 2018-03-19: qty 1

## 2018-03-19 MED ORDER — LACTATED RINGERS IV SOLN
INTRAVENOUS | Status: DC | PRN
  Administered 2018-03-19 (×2): via INTRAVENOUS

## 2018-03-19 MED ORDER — CEFAZOLIN SODIUM-DEXTROSE 2-4 GM/100ML-% IV SOLN
2.0000 g | Freq: Three times a day (TID) | INTRAVENOUS | Status: AC
  Administered 2018-03-19 – 2018-03-20 (×3): 2 g via INTRAVENOUS
  Filled 2018-03-19 (×3): qty 100

## 2018-03-19 MED ORDER — VANCOMYCIN HCL 1000 MG IV SOLR
INTRAVENOUS | Status: AC
  Filled 2018-03-19: qty 2000

## 2018-03-19 MED ORDER — PROMETHAZINE HCL 25 MG/ML IJ SOLN: 6.2500 mg | INTRAMUSCULAR | Status: DC | PRN

## 2018-03-19 MED ORDER — TOBRAMYCIN SULFATE 1.2 G IJ SOLR
INTRAMUSCULAR | Status: DC | PRN
  Administered 2018-03-19: 1.2 g

## 2018-03-19 MED ORDER — HYDROMORPHONE HCL 1 MG/ML IJ SOLN
0.2500 mg | INTRAMUSCULAR | Status: DC | PRN
  Administered 2018-03-19 (×4): 0.5 mg via INTRAVENOUS

## 2018-03-19 MED ORDER — MIDAZOLAM HCL 5 MG/5ML IJ SOLN
INTRAMUSCULAR | Status: DC | PRN
  Administered 2018-03-19: 2 mg via INTRAVENOUS

## 2018-03-19 MED ORDER — BACITRACIN ZINC 500 UNIT/GM EX OINT
TOPICAL_OINTMENT | CUTANEOUS | Status: AC
  Filled 2018-03-19: qty 28.35

## 2018-03-19 MED ORDER — FENTANYL CITRATE (PF) 100 MCG/2ML IJ SOLN
INTRAMUSCULAR | Status: DC | PRN
  Administered 2018-03-19 (×2): 100 ug via INTRAVENOUS
  Administered 2018-03-19: 50 ug via INTRAVENOUS
  Administered 2018-03-19 (×2): 100 ug via INTRAVENOUS
  Administered 2018-03-19: 50 ug via INTRAVENOUS

## 2018-03-19 SURGICAL SUPPLY — 69 items
BANDAGE ACE 3X5.8 VEL STRL LF (GAUZE/BANDAGES/DRESSINGS) ×2 IMPLANT
BANDAGE ELASTIC 4 VELCRO ST LF (GAUZE/BANDAGES/DRESSINGS) ×2 IMPLANT
BIT DRILL 1.1 K-WIRE TH 70 (BIT) ×2 IMPLANT
BIT DRILL 1.5 FOR 62 (BIT) ×2 IMPLANT
BNDG COHESIVE 4X5 TAN STRL (GAUZE/BANDAGES/DRESSINGS) ×5 IMPLANT
BNDG GAUZE ELAST 4 BULKY (GAUZE/BANDAGES/DRESSINGS) ×10 IMPLANT
BRUSH SCRUB EZ  4% CHG (MISCELLANEOUS) ×4
BRUSH SCRUB EZ 4% CHG (MISCELLANEOUS) IMPLANT
BRUSH SCRUB SURG 4.25 DISP (MISCELLANEOUS) ×10 IMPLANT
CANISTER WOUNDNEG PRESSURE 500 (CANNISTER) ×2 IMPLANT
CHLORAPREP W/TINT 26ML (MISCELLANEOUS) ×7 IMPLANT
COVER MAYO STAND STRL (DRAPES) ×5 IMPLANT
COVER SURGICAL LIGHT HANDLE (MISCELLANEOUS) ×10 IMPLANT
COVER WAND RF STERILE (DRAPES) ×5 IMPLANT
DRAPE ORTHO SPLIT 77X108 STRL (DRAPES) ×5
DRAPE SURG 17X23 STRL (DRAPES) ×5 IMPLANT
DRAPE SURG ORHT 6 SPLT 77X108 (DRAPES) ×3 IMPLANT
DRAPE U-SHAPE 47X51 STRL (DRAPES) ×5 IMPLANT
DRSG ADAPTIC 3X8 NADH LF (GAUZE/BANDAGES/DRESSINGS) ×5 IMPLANT
DRSG EMULSION OIL 3X3 NADH (GAUZE/BANDAGES/DRESSINGS) ×2 IMPLANT
ELECT REM PT RETURN 9FT ADLT (ELECTROSURGICAL)
ELECTRODE REM PT RTRN 9FT ADLT (ELECTROSURGICAL) IMPLANT
EVACUATOR 1/8 PVC DRAIN (DRAIN) IMPLANT
GAUZE SPONGE 4X4 12PLY STRL (GAUZE/BANDAGES/DRESSINGS) ×7 IMPLANT
GLOVE BIO SURGEON STRL SZ7.5 (GLOVE) ×20 IMPLANT
GLOVE BIOGEL PI IND STRL 7.5 (GLOVE) ×3 IMPLANT
GLOVE BIOGEL PI INDICATOR 7.5 (GLOVE) ×2
GOWN STRL REUS W/ TWL LRG LVL3 (GOWN DISPOSABLE) ×6 IMPLANT
GOWN STRL REUS W/TWL LRG LVL3 (GOWN DISPOSABLE) ×10
GUIDEWIRE THREADED 2.8 (WIRE) ×6 IMPLANT
HANDPIECE INTERPULSE COAX TIP (DISPOSABLE) ×5
KIT BASIN OR (CUSTOM PROCEDURE TRAY) ×5 IMPLANT
KIT PLEURX DRAIN CATH 500ML (KITS) ×2 IMPLANT
KIT PREVENA INCISION MGT 13 (CANNISTER) ×2 IMPLANT
KIT TURNOVER KIT B (KITS) ×5 IMPLANT
MANIFOLD NEPTUNE II (INSTRUMENTS) ×5 IMPLANT
NS IRRIG 1000ML POUR BTL (IV SOLUTION) ×5 IMPLANT
PACK ORTHO EXTREMITY (CUSTOM PROCEDURE TRAY) ×5 IMPLANT
PAD ARMBOARD 7.5X6 YLW CONV (MISCELLANEOUS) ×10 IMPLANT
PAD CAST 3X4 CTTN HI CHSV (CAST SUPPLIES) IMPLANT
PAD CAST 4YDX4 CTTN HI CHSV (CAST SUPPLIES) IMPLANT
PADDING CAST COTTON 3X4 STRL (CAST SUPPLIES) ×5
PADDING CAST COTTON 4X4 STRL (CAST SUPPLIES) ×5
PADDING CAST COTTON 6X4 STRL (CAST SUPPLIES) ×5 IMPLANT
PLATE Y VA 1.5 3H HD-7H SHAFT (Plate) ×2 IMPLANT
SCREW CANN 6.5X70MM (Screw) ×2 IMPLANT
SCREW CANN 6.5X75MM (Screw) ×2 IMPLANT
SCREW CORT STRDRV THRD 1.5X14 (Screw) ×2 IMPLANT
SCREW LCP STARDRIVE ST 1.5X118 (Screw) ×2 IMPLANT
SCREW LCP STARDRIVE ST 1.5X12 (Screw) ×6 IMPLANT
SCREW LOCK VA STRDRV 1.5X18 (Screw) ×4 IMPLANT
SCREW T4 STARDRIVE ST 1.5X16 (Screw) ×2 IMPLANT
SET HNDPC FAN SPRY TIP SCT (DISPOSABLE) IMPLANT
SPONGE LAP 18X18 X RAY DECT (DISPOSABLE) ×5 IMPLANT
SUT ETHILON 2 0 FS 18 (SUTURE) ×8 IMPLANT
SUT ETHILON 3 0 PS 1 (SUTURE) ×6 IMPLANT
SUT ETHILON 4 0 PS 2 18 (SUTURE) ×2 IMPLANT
SUT MON AB 2-0 CT1 36 (SUTURE) ×3 IMPLANT
SUT PDS AB 0 CT 36 (SUTURE) IMPLANT
SUT VIC AB 3-0 PS2 18 (SUTURE) ×2 IMPLANT
SWAB CULTURE ESWAB REG 1ML (MISCELLANEOUS) IMPLANT
TOWEL OR 17X24 6PK STRL BLUE (TOWEL DISPOSABLE) ×5 IMPLANT
TOWEL OR 17X26 10 PK STRL BLUE (TOWEL DISPOSABLE) ×10 IMPLANT
TRAY WAYNE PNEUMOTHORAX 14X18 (TRAY / TRAY PROCEDURE) ×2 IMPLANT
TUBE CONNECTING 12'X1/4 (SUCTIONS) ×2
TUBE CONNECTING 12X1/4 (SUCTIONS) ×5 IMPLANT
UNDERPAD 30X30 (UNDERPADS AND DIAPERS) ×5 IMPLANT
WATER STERILE IRR 1000ML POUR (IV SOLUTION) ×5 IMPLANT
YANKAUER SUCT BULB TIP NO VENT (SUCTIONS) ×5 IMPLANT
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
## 2018-03-19 NOTE — Transfer of Care (Signed)
Immediate Anesthesia Transfer of Care Note  Patient: Dustin [name]  Procedure(s) Performed: OPEN REDUCTION INTERNAL FIXATION (ORIF) METACARPAL (Right Hand) IRRIGATION AND DEBRIDEMENT RIGHT CALCANEUS FRACTURE APPLICATION OF WOUND VAC (Right Ankle) CHEST TUBE INSERTION (Right Chest)  Patient Location: PACU  Anesthesia Type:General  Level of Consciousness: awake, alert , oriented and sedated  Airway & Oxygen Therapy: Patient connected to face mask oxygen  Post-op Assessment: Report given to RN, Post -op Vital signs reviewed and stable and Patient moving all extremities  Post vital signs: Reviewed and stable  Last Vitals:  Vitals Value Taken Time  BP 137/78 03/19/2018 12:04 PM  Temp    Pulse 125 03/19/2018 12:11 PM  Resp 20 03/19/2018 12:11 PM  SpO2 87 % 03/19/2018 12:11 PM  Vitals shown include unvalidated device data.  Last Pain:  Vitals:   03/19/18 0300  TempSrc: Oral  PainSc:       Patients Stated Pain Goal: 0 (03/17/18 1302)  Complications: No apparent anesthesia complications

## 2018-03-19 NOTE — Anesthesia Procedure Notes (Addendum)
Procedure Name: Intubation Date/Time: 03/19/2018 8:39 AM Performed by: Fransisca Kaufmann, CRNA Pre-anesthesia Checklist: Patient identified, Emergency Drugs available, Suction available and Patient being monitored Patient Re-evaluated:Patient Re-evaluated prior to induction Oxygen Delivery Method: Circle System Utilized Preoxygenation: Pre-oxygenation with 100% oxygen Induction Type: IV induction Ventilation: Mask ventilation without difficulty Laryngoscope Size: Miller and 2 Grade View: Grade I Tube type: Oral Tube size: 7.5 mm Number of attempts: 1 Airway Equipment and Method: Stylet and Oral airway Placement Confirmation: ETT inserted through vocal cords under direct vision and positive ETCO2 Secured at: 22 cm Tube secured with: Tape Dental Injury: Teeth and Oropharynx as per pre-operative assessment  Comments: Chest tube placed by Dr Janee Morn after intubation/right chest/to suction per Dr Janee Morn by circulating RN
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
## 2018-03-19 NOTE — Progress Notes (Signed)
Noticed patients oxygen saturation decreased, patient c/o sob and cough. Adjusted patient in bed, increased [place] O2. Paged MD on call. Orders received for STAT chest XRay NRB. Will continue to monitor and treat per MD orders.

## 2018-03-19 NOTE — Anesthesia Postprocedure Evaluation (Signed)
Anesthesia Post Note  Patient: Dustin [name]  Procedure(s) Performed: OPEN REDUCTION INTERNAL FIXATION (ORIF) METACARPAL (Right Hand) IRRIGATION AND DEBRIDEMENT RIGHT CALCANEUS FRACTURE APPLICATION OF WOUND VAC (Right Ankle) CHEST TUBE INSERTION (Right Chest)     Patient location during evaluation: PACU Anesthesia Type: General Level of consciousness: awake and alert and oriented Pain management: pain level controlled Vital Signs Assessment: post-procedure vital signs reviewed and stable Respiratory status: spontaneous breathing, nonlabored ventilation, respiratory function stable and patient connected to nasal cannula oxygen Cardiovascular status: blood pressure returned to baseline and stable Postop Assessment: no apparent nausea or vomiting Anesthetic complications: no    Last Vitals:  Vitals:   03/19/18 1240 03/19/18 1255  BP: (!) 148/98 (!) 144/90  Pulse: (!) 102 (!) 102  Resp: (!) 23 13  Temp:    SpO2: 94% 95%    Last Pain:  Vitals:   03/19/18 1254  TempSrc:   PainSc: 7                  Rondalyn Belford A.

## 2018-03-19 NOTE — Op Note (Signed)
03/17/2018 - 03/19/2018  8:54 AM  PATIENT:  Dustin [name]  33 y.o. male  PRE-OPERATIVE DIAGNOSIS:  Right pneumothorax  POST-OPERATIVE DIAGNOSIS:  Right pneumothorax  PROCEDURE:  Procedure(s): INSERTION RIGHT CHEST TUBE 14FR  SURGEON:  Violeta Gelinas, MD  ASSISTANTS: none   ANESTHESIA:   general  EBL:  No intake/output data recorded.  BLOOD ADMINISTERED:none  DRAINS: 1 Chest Tube(s) in the right chest   SPECIMEN:  No Specimen  DISPOSITION OF SPECIMEN:  N/A  COUNTS:  YES  DICTATION: .Dragon Dictation Dustin [name] had some shortness of breath overnight and his right pneumothorax has gotten a little larger.  We are proceeding with placement of right chest tube in the operating room prior to his orthopedic procedure.  Informed consent was obtained.  He received intravenous antibiotics.  He was brought to the operating room and general endotracheal anesthesia was administered by the anesthesia staff.  His right chest was prepped and draped in a sterile fashion.  Timeout procedure was performed.  A small incision was made at the anterior axillary line above the nipple level.  The chest cavity was then entered using the needle and guidewire was placed.  The dilator was used and then a 14 French chest tube was placed via standard Seldinger technique.  It was sutured in place.  It was hooked up to Pleur-evac.  A sterile dressing will be applied.  He tolerated this procedure well.  He remained in the operating room with Dr. Jena Gauss.  No complications. PATIENT DISPOSITION:  remains in OR with Dr. Jena Gauss   Delay start of Pharmacological VTE agent (>24hrs) due to surgical blood loss or risk of bleeding:  no  Violeta Gelinas, MD, MPH, FACS Pager: [phone]  11/6/20198:54 AM

## 2018-03-19 NOTE — Plan of Care (Signed)
  Problem: Education: Goal: Knowledge of General Education information will improve Description: Including pain rating scale, medication(s)/side effects and non-pharmacologic comfort measures Outcome: Progressing   Problem: Health Behavior/Discharge Planning: Goal: Ability to manage health-related needs will improve Outcome: Progressing   Problem: Clinical Measurements: Goal: Ability to maintain clinical measurements within normal limits will improve Outcome: Progressing   Problem: Clinical Measurements: Goal: Will remain free from infection Outcome: Progressing   Problem: Clinical Measurements: Goal: Diagnostic test results will improve Outcome: Progressing   Problem: Clinical Measurements: Goal: Cardiovascular complication will be avoided Outcome: Progressing   

## 2018-03-19 NOTE — Progress Notes (Signed)
Patient ID: Dustin [name], male   DOB: 09-13-1985, 32 y.o.   MRN: [id number] See other note from this AM. Some SOB overnight. R PTX has gotten larger. Will place R chest tube in OR at the beginning of the case. I discussed the procedure, risks, and benefits with him and he agrees.  Violeta Gelinas, MD, MPH, FACS Trauma: [phone] General Surgery: [phone]

## 2018-03-20 ENCOUNTER — Inpatient Hospital Stay (HOSPITAL_COMMUNITY): Payer: Worker's Compensation

## 2018-03-20 LAB — CBC
HCT: 29 % — ABNORMAL LOW (ref 39.0–52.0)
HEMOGLOBIN: 9.5 g/dL — AB (ref 13.0–17.0)
MCH: 32.2 pg (ref 26.0–34.0)
MCHC: 32.8 g/dL (ref 30.0–36.0)
MCV: 98.3 fL (ref 80.0–100.0)
Platelets: 168 10*3/uL (ref 150–400)
RBC: 2.95 MIL/uL — AB (ref 4.22–5.81)
RDW: 11.9 % (ref 11.5–15.5)
WBC: 15.9 10*3/uL — ABNORMAL HIGH (ref 4.0–10.5)
nRBC: 0 % (ref 0.0–0.2)
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
## 2018-03-20 NOTE — Progress Notes (Signed)
Chest tube found to be at suction. Pt placed on water seal per order at 2200

## 2018-03-20 NOTE — Progress Notes (Signed)
OT Cancellation Note  Patient Details Name: Dustin [name] MRN: [id number] DOB: 08-07-1985   Cancelled Treatment:    Reason Eval/Treat Not Completed: Other (comment)(Per RN plans to pull chest tube later today) Will plan on evaluation after tube removal.   Evern Bio Kenya Kook 03/20/2018, 11:56 AM  Sherryl Manges OTR/L Acute Rehabilitation Services Pager: [phone] Office: [phone]
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
## 2018-03-20 NOTE — Progress Notes (Signed)
Patient states that his front right tooth in the middle is loose and it is infringing on his ability to eat. Nurse educated patient that we are not allowed to pull it.   Noted.
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
## 2018-03-21 ENCOUNTER — Inpatient Hospital Stay (HOSPITAL_COMMUNITY): Payer: Worker's Compensation

## 2018-03-21 ENCOUNTER — Encounter (HOSPITAL_COMMUNITY): Payer: Self-pay | Admitting: Student

## 2018-03-21 LAB — CBC
HEMATOCRIT: 29.2 % — AB (ref 39.0–52.0)
Hemoglobin: 9.6 g/dL — ABNORMAL LOW (ref 13.0–17.0)
MCH: 32.2 pg (ref 26.0–34.0)
MCHC: 32.9 g/dL (ref 30.0–36.0)
MCV: 98 fL (ref 80.0–100.0)
NRBC: 0 % (ref 0.0–0.2)
PLATELETS: 221 10*3/uL (ref 150–400)
RBC: 2.98 MIL/uL — ABNORMAL LOW (ref 4.22–5.81)
RDW: 11.9 % (ref 11.5–15.5)
WBC: 10.4 10*3/uL (ref 4.0–10.5)

## 2018-03-21 MED ORDER — ENOXAPARIN SODIUM 40 MG/0.4ML ~~LOC~~ SOLN: 40.0000 mg | SUBCUTANEOUS | 0 refills | Status: DC

## 2018-03-21 MED ORDER — DOCUSATE SODIUM 100 MG PO CAPS: 100.0000 mg | ORAL_CAPSULE | Freq: Two times a day (BID) | ORAL | 0 refills | Status: DC

## 2018-03-21 MED ORDER — POLYETHYLENE GLYCOL 3350 17 G PO PACK: 17.0000 g | PACK | Freq: Every day | ORAL | 0 refills | Status: DC

## 2018-03-21 MED ORDER — ACETAMINOPHEN 325 MG PO TABS: 650.0000 mg | ORAL_TABLET | Freq: Four times a day (QID) | ORAL | Status: DC

## 2018-03-21 MED ORDER — OXYCODONE HCL 10 MG PO TABS: 10.0000 mg | ORAL_TABLET | Freq: Four times a day (QID) | ORAL | 0 refills | Status: DC | PRN

## 2018-03-21 MED FILL — oxyCODONE HCL 10 MG TABS: 10 | 8 days supply | Qty: 30 | Fill #0

## 2018-03-21 MED FILL — ENOXAPARIN 40 MG/0.4 ML SYR: 40 | 30 days supply | Qty: 12 | Fill #0
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
## 2018-03-21 NOTE — Progress Notes (Signed)
OT Cancellation Note  Patient Details Name: Dustin [name] MRN: [id number] DOB: Mar 13, 1986   Cancelled Treatment:    Reason Eval/Treat Not Completed: Other (comment); pt with recent chest tube removal and planning for chest x-ray soon. Will follow up after x-ray completion and as schedule permits.  Marcy Siren, OT [facility] Pager [phone] Office [phone]   Orlando Penner 03/21/2018, 2:44 PM
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
## 2018-03-21 NOTE — Progress Notes (Signed)
Patient suffers from fractured thumb and calcaneous which impairs their ability to perform daily activities like walking in the home.  A walker will not resolve  issue with performing activities of daily living. A wheelchair will allow patient to safely perform daily activities. Patient can safely propel the wheelchair in the home or has a caregiver who can provide assistance.  Accessories: elevating leg rests (ELRs), wheel locks, extensions and anti-tippers. Pt NWB on RUE and RLE  Dustin [name] 2:02 PM 03/21/2018

## 2018-03-21 NOTE — Discharge Instructions (Addendum)
[facility] Discharge Instructions   General Discharge Instructions  WEIGHT BEARING STATUS: Nonweightbearing to right leg, no weightbearing through right hand  RANGE OF MOTION/ACTIVITY:Okay to move ankle and foot  Wound Care: As below  DVT/PE prophylaxis:Lovenox to prevent blood clots  Diet: as you were eating previously.  Can use over the counter stool softeners and bowel preparations, such as Miralax, to help with bowel movements.  Narcotics can be constipating.  Be sure to drink plenty of fluids  PAIN MEDICATION USE AND EXPECTATIONS  You have likely been given narcotic medications to help control your pain.  After a traumatic event that results in an fracture (broken bone) with or without surgery, it is ok to use narcotic pain medications to help control one's pain.  We understand that everyone responds to pain differently and each individual patient will be evaluated on a regular basis for the continued need for narcotic medications. Ideally, narcotic medication use should last no more than 6-8 weeks (coinciding with fracture healing).   As a patient it is your responsibility as well to monitor narcotic medication use and report the amount and frequency you use these medications when you come to your office visit.   We would also advise that if you are using narcotic medications, you should take a dose prior to therapy to maximize you participation.  IF YOU ARE ON NARCOTIC MEDICATIONS IT IS NOT PERMISSIBLE TO OPERATE A MOTOR VEHICLE (MOTORCYCLE/CAR/TRUCK/MOPED) OR HEAVY MACHINERY DO NOT MIX NARCOTICS WITH OTHER CNS (CENTRAL NERVOUS SYSTEM) DEPRESSANTS SUCH AS ALCOHOL   STOP SMOKING OR USING NICOTINE PRODUCTS!!!!  As discussed nicotine severely impairs your body's ability to heal surgical and traumatic wounds but also impairs bone healing.  Wounds and bone heal by forming microscopic blood vessels (angiogenesis) and nicotine is a vasoconstrictor (essentially, shrinks blood  vessels).  Therefore, if vasoconstriction occurs to these microscopic blood vessels they essentially disappear and are unable to deliver necessary nutrients to the healing tissue.  This is one modifiable factor that you can do to dramatically increase your chances of healing your injury.    (This means no smoking, no nicotine gum, patches, etc)  DO NOT USE NONSTEROIDAL ANTI-INFLAMMATORY DRUGS (NSAID'S)  Using products such as Advil (ibuprofen), Aleve (naproxen), Motrin (ibuprofen) for additional pain control during fracture healing can delay and/or prevent the healing response.  If you would like to take over the counter (OTC) medication, Tylenol (acetaminophen) is ok.  However, some narcotic medications that are given for pain control contain acetaminophen as well. Therefore, you should not exceed more than 4000 mg of tylenol in a day if you do not have liver disease.  Also note that there are may OTC medicines, such as cold medicines and allergy medicines that my contain tylenol as well.  If you have any questions about medications and/or interactions please ask your doctor/PA or your pharmacist.      ICE AND ELEVATE INJURED/OPERATIVE EXTREMITY  Using ice and elevating the injured extremity above your heart can help with swelling and pain control.  Icing in a pulsatile fashion, such as 20 minutes on and 20 minutes off, can be followed.    Do not place ice directly on skin. Make sure there is a barrier between to skin and the ice pack.    Using frozen items such as frozen peas works well as the conform nicely to the are that needs to be iced.  USE AN ACE WRAP OR TED HOSE FOR SWELLING CONTROL  In addition  to icing and elevation, Ace wraps or TED hose are used to help limit and resolve swelling.  It is recommended to use Ace wraps or TED hose until you are informed to stop.    When using Ace Wraps start the wrapping distally (farthest away from the body) and wrap proximally (closer to the  body)   Example: If you had surgery on your leg or thing and you do not have a splint on, start the ace wrap at the toes and work your way up to the thigh        If you had surgery on your upper extremity and do not have a splint on, start the ace wrap at your fingers and work your way up to the upper arm  IF YOU ARE IN A SPLINT OR CAST DO NOT REMOVE IT FOR ANY REASON   If your splint gets wet for any reason please contact the office immediately. You may shower in your splint or cast as long as you keep it dry.  This can be done by wrapping in a cast cover or garbage back (or similar)  Do Not stick any thing down your splint or cast such as pencils, money, or hangers to try and scratch yourself with.  If you feel itchy take benadryl as prescribed on the bottle for itching  IF YOU ARE IN A CAM BOOT (BLACK BOOT)  You may remove boot periodically. Perform daily dressing changes as noted below.  Wash the liner of the boot regularly and wear a sock when wearing the boot. It is recommended that you sleep in the boot until told otherwise  CALL THE OFFICE WITH ANY QUESTIONS OR CONCERNS: [phone]   Discharge Wound Care Instructions  Do NOT apply any ointments, solutions or lotions to pin sites or surgical wounds.  These prevent needed drainage and even though solutions like hydrogen peroxide kill bacteria, they also damage cells lining the pin sites that help fight infection.  Applying lotions or ointments can keep the wounds moist and can cause them to breakdown and open up as well. This can increase the risk for infection. When in doubt call the office.  Surgical incisions should be dressed daily.  If any drainage is noted, use one layer of adaptic, then gauze, Kerlix, and an ace wrap.  Once the incision is completely dry and without drainage, it may be left open to air out.  Showering may begin 36-48 hours later.  Cleaning gently with soap and water.  Traumatic wounds should be dressed daily as  well.    One layer of adaptic, gauze, Kerlix, then ace wrap.  The adaptic can be discontinued once the draining has ceased    If you have a wet to dry dressing: wet the gauze with saline the squeeze as much saline out so the gauze is moist (not soaking wet), place moistened gauze over wound, then place a dry gauze over the moist one, followed by Kerlix wrap, then ace wrap.    Pneumothorax A pneumothorax, commonly called a collapsed lung, is a condition in which air leaks from a lung and builds up in the space between the lung and the chest wall (pleural space). The air in a pneumothorax is trapped outside the lung and takes up space, preventing the lung from fully expanding. This is a condition that usually occurs suddenly. The buildup of air may be small or large. A small pneumothorax may go away on its own. When a pneumothorax is larger,  it will often require medical treatment and hospitalization. What are the causes? A pneumothorax can sometimes happen quickly with no apparent cause. People with underlying lung problems, particularly COPD or emphysema, are at higher risk of pneumothorax. However, pneumothorax can happen quickly even in people with no prior known lung problems. Trauma, surgery, medical procedures, or injury to the chest wall can also cause a pneumothorax. What are the signs or symptoms? Sometimes a pneumothorax will have no symptoms. When symptoms are present, they can include:  Chest pain.  Shortness of breath.  Increased rate of breathing.  Bluish color to your lips or skin (cyanosis).  How is this diagnosed? Pneumothorax is usually diagnosed by a chest X-ray or chest CT scan. Your health care provider will also take a medical history and perform a physical exam to determine why you may have a pneumothorax. How is this treated? A small pneumothorax may go away on its own without treatment. Extra oxygen can sometimes help a small pneumothorax go away more quickly. For a  larger pneumothorax or a pneumothorax that is causing symptoms, a procedure is usually needed to drain the air.In some cases, the health care provider may drain the air using a needle. In other cases, a chest tube may be inserted into the pleural space. A chest tube is a small tube placed between the ribs and into the pleural space. This removes the extra air and allows the lung to expand back to its normal size. A large pneumothorax will usually require a hospital stay. If there is ongoing air leakage into the pleural space, then the chest tube may need to remain in place for several days until the air leak has healed. In some cases, surgery may be needed. Follow these instructions at home:  Only take over-the-counter or prescription medicines as directed by your health care provider.  If a cough or pain makes it difficult for you to sleep at night, try sleeping in a semi-upright position in a recliner or by using 2 or 3 pillows.  Rest and limit activity as directed by your health care provider.  If you had a chest tube and it was removed, ask your health care provider when it is okay to remove the dressing. Until your health care provider says you can remove the dressing, do not allow it to get wet.  Do not smoke. Smoking is a risk factor for pneumothorax.  Do not fly in an airplane or scuba dive until your health care provider says it is okay.  Follow up with your health care provider as directed. Get help right away if:  You have increasing chest pain or shortness of breath.  You have a cough that is not controlled with suppressants.  You begin coughing up blood.  You have pain that is getting worse or is not controlled with medicines.  You cough up thick, discolored mucus (sputum) that is yellow to green in color.  You have redness, increasing pain, or discharge at the site where a chest tube had been in place (if your pneumothorax was treated with a chest tube).  The site where  your chest tube was located opens up.  You feel air coming out of the site where the chest tube was placed.  You have a fever or persistent symptoms for more than 2-3 days.  You have a fever and your symptoms suddenly get worse. This information is not intended to replace advice given to you by your health care provider. Make sure  you discuss any questions you have with your health care provider. Document Released: 04/30/2005 Document Revised: 10/06/2015 Document Reviewed: 09/23/2013 Elsevier Interactive Patient Education  2018 [employer]  DO NOT Remove your chest tube dressing for 3 days. Do not shower over it. You may remove it on 08/12 and shower as normal.   1. PAIN CONTROL:  1. Pain is best controlled by a usual combination of three different methods TOGETHER:  1. Ice/Heat 2. Over the counter pain medication 3. Prescription pain medication 2. Most patients will experience some swelling and bruising around wounds. Ice packs or heating pads (30-60 minutes up to 6 times a day) will help. Use ice for the first few days to help decrease swelling and bruising, then switch to heat to help relax tight/sore spots and speed recovery. Some people prefer to use ice alone, heat alone, alternating between ice & heat. Experiment to what works for you. Swelling and bruising can take several weeks to resolve.  3. It is helpful to take an over-the-counter pain medication regularly for the first few weeks. Choose one of the following that works best for you:  1. Naproxen (Aleve, etc) Two 220mg  tabs twice a day 2. Ibuprofen (Advil, etc) Three 200mg  tabs four times a day (every meal & bedtime) 3. Acetaminophen (Tylenol, etc) 500-650mg  four times a day (every meal & bedtime) 4. A prescription for pain medication (such as oxycodone, hydrocodone, etc) should be given to you upon discharge. Take your pain medication as prescribed.  1. If you are having problems/concerns with the prescription medicine (does  not control pain, nausea, vomiting, rash, itching, etc), please call us [phone] to see if we need to switch you to a different pain medicine that will work better for you and/or control your side effect better. 2. If you need a refill on your pain medication, please contact your pharmacy. They will contact our office to request authorization. Prescriptions will not be filled after 5 pm or on week-ends. 4. Avoid getting constipated. When taking pain medications, it is common to experience some constipation. Increasing fluid intake and taking a fiber supplement (such as Metamucil, Citrucel, FiberCon, MiraLax, etc) 1-2 times a day regularly will usually help prevent this problem from occurring. A mild laxative (prune juice, Milk of Magnesia, MiraLax, etc) should be taken according to package directions if there are no bowel movements after 48 hours.  5. Watch out for diarrhea. If you have many loose bowel movements, simplify your diet to bland foods & liquids for a few days. Stop any stool softeners and decrease your fiber supplement. Switching to mild anti-diarrheal medications (Kayopectate, Pepto Bismol) can help. If this worsens or does not improve, please call us. 6. Wash / shower every day. You may shower daily and replace your bandges after showering. No bathing or submerging your wounds in water until they heal. 7. FOLLOW UP in our office  1. Please call [facility] at [phone] to set up an appointment for a follow-up appointment approximately 2-3 weeks after discharge for wound check  WHEN TO CALL us [phone]:  1. Poor pain control 2. Reactions / problems with new medications (rash/itching, nausea, etc)  3. Fever over 101.5 F (38.5 C) 4. Worsening swelling or bruising 5. Continued bleeding from wounds. 6. Increased pain, redness, or drainage from the wounds which could be signs of infection  The clinic staff is available to answer your questions during regular business hours  (8:30am-5pm). Please dont hesitate to call and ask  to speak to one of our nurses for clinical concerns.  If you have a medical emergency, go to the nearest emergency room or call 911.  A surgeon from [facility] is always on call at the [facility], [place]  [address], [address], [place], [place] [address] ?  MAIN: [phone] ? TOLL FREE: [phone] ?  FAX [phone]  [email or web address]

## 2018-03-21 NOTE — Plan of Care (Signed)
No acute events during night. Pt still requiring prn pain medication.  Problem: Education: Goal: Knowledge of General Education information will improve Description Including pain rating scale, medication(s)/side effects and non-pharmacologic comfort measures Outcome: Progressing   Problem: Health Behavior/Discharge Planning: Goal: Ability to manage health-related needs will improve Outcome: Progressing   Problem: Clinical Measurements: Goal: Ability to maintain clinical measurements within normal limits will improve Outcome: Progressing Goal: Will remain free from infection Outcome: Progressing Goal: Diagnostic test results will improve Outcome: Progressing Goal: Respiratory complications will improve Outcome: Progressing Goal: Cardiovascular complication will be avoided Outcome: Progressing   Problem: Activity: Goal: Risk for activity intolerance will decrease Outcome: Progressing   Problem: Nutrition: Goal: Adequate nutrition will be maintained Outcome: Progressing   Problem: Coping: Goal: Level of anxiety will decrease Outcome: Progressing   Problem: Elimination: Goal: Will not experience complications related to bowel motility Outcome: Progressing Goal: Will not experience complications related to urinary retention Outcome: Progressing   Problem: Pain Managment: Goal: General experience of comfort will improve Outcome: Progressing   Problem: Safety: Goal: Ability to remain free from injury will improve Outcome: Progressing   Problem: Skin Integrity: Goal: Risk for impaired skin integrity will decrease Outcome: Progressing
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
## 2018-03-21 NOTE — Progress Notes (Signed)
Orthopedic Tech Progress Note Patient Details:  Darnel Mchan December 20, 1985 [id number]  Ortho Devices Type of Ortho Device: CAM walker Splint Material: Plaster Ortho Device/Splint Location: rue Ortho Device/Splint Interventions: Application   Post Interventions Patient Tolerated: Well Instructions Provided: Care of device, Adjustment of device   Norva Karvonen T 03/21/2018, 11:32 AM
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
## 2018-03-21 NOTE — Plan of Care (Signed)

## 2018-03-24 ENCOUNTER — Encounter: Payer: Self-pay | Admitting: Student

## 2018-03-24 DIAGNOSIS — S92061B Displaced intraarticular fracture of right calcaneus, initial encounter for open fracture: Secondary | ICD-10-CM | POA: Insufficient documentation

## 2018-03-24 DIAGNOSIS — J939 Pneumothorax, unspecified: Secondary | ICD-10-CM | POA: Insufficient documentation

## 2018-03-24 DIAGNOSIS — S62221A Displaced Rolando's fracture, right hand, initial encounter for closed fracture: Secondary | ICD-10-CM | POA: Insufficient documentation

## 2018-03-24 DIAGNOSIS — S2249XA Multiple fractures of ribs, unspecified side, initial encounter for closed fracture: Secondary | ICD-10-CM | POA: Insufficient documentation

## 2018-03-31 ENCOUNTER — Other Ambulatory Visit: Payer: Self-pay | Admitting: General Surgery

## 2018-03-31 ENCOUNTER — Ambulatory Visit
Admission: RE | Admit: 2018-03-31 | Discharge: 2018-03-31 | Disposition: A | Discharge status: Home / Self Care | Payer: Self-pay | Source: Ambulatory Visit | Attending: General Surgery | Admitting: General Surgery

## 2018-03-31 DIAGNOSIS — Z9689 Presence of other specified functional implants: Secondary | ICD-10-CM

## 2018-11-20 NOTE — Pre-Procedure Instructions (Signed)
Leone BrandRichard W Mainer  11/20/2018    Your procedure is scheduled on Friday, July 17.  [facility] Main Entrance or Entrance "A" at 6:00 AM                       Your surgery or procedure is scheduled for  8:00  A.M.  Call this number if you have problems the morning of surgery: [phone]  This is the number for the Pre- Surgical Desk.                 For any other questions, please call [phone], Monday - Friday 8 AM - 4 PM   Remember:  Do not eat or drink after midnight Thursday, July 16                       Take these medicines the morning of surgery with A SIP OF WATER :    If needed Acetaminophen, Tramadal   1 Week prior to surgery STOP/ Do Not Start taking Aspirin, Aspirin Products (Goody Powder, Excedrin Migraine), Ibuprofen (Advil), Naproxen (Aleve), Vitamins and Herbal Products (ie Fish Oil). Special instructions:  If you  Smoke , do not smoke within 24 hours of surgery.  [facility]- Preparing For Surgery  Before surgery, you can play an important role. Because skin is not sterile, your skin needs to be as free of germs as possible. You can reduce the number of germs on your skin by washing with CHG (chlorahexidine gluconate) Soap before surgery.  CHG is an antiseptic cleaner which kills germs and bonds with the skin to continue killing germs even after washing.    Oral Hygiene is also important to reduce your risk of infection.  Remember - BRUSH YOUR TEETH THE MORNING OF SURGERY WITH YOUR REGULAR TOOTHPASTE  Please do not use if you have an allergy to CHG or antibacterial soaps. If your skin becomes reddened/irritated stop using the CHG.  Do not shave (including legs and underarms) for at least 48 hours prior to first CHG shower. It is OK to shave your face.  Please follow these instructions carefully.   1. Shower the NIGHT BEFORE SURGERY and the MORNING OF SURGERY with CHG.   2. If you chose to wash your hair, wash your hair first as usual with  your normal shampoo.  3. After you shampoo, wash your face and private area with the soap you use at home, then rinse your hair and body thoroughly to remove the shampoo and soap.  4. Use CHG as you would any other liquid soap. You can apply CHG directly to the skin and wash gently with a scrungie or a clean washcloth.   Apply the CHG Soap to your body ONLY FROM THE NECK DOWN.  Do not use on open wounds or open sores. Avoid contact with your eyes, ears, mouth and genitals (private parts).  5. Wash thoroughly, paying special attention to the area where your surgery will be performed.  6. Thoroughly rinse your body with warm water from the neck down.  7. DO NOT shower/wash with your normal soap after using and rinsing off the CHG Soap.  8. Pat yourself dry with a CLEAN TOWEL.  9. Wear CLEAN PAJAMAS to bed the night before surgery, wear comfortable clothes the morning of surgery  10. Place CLEAN SHEETS on your bed the night of your first shower and DO NOT SLEEP WITH PETS.  Day of Surgery:.   Shower as instructed above Do not wear lotions, powders, or perfumes, or deodorant. Please wear clean clothes to the hospital/surgery center.   Remember to brush your teeth WITH YOUR REGULAR TOOTHPASTE.  Do not wear jewelry, make-up or nail polish.   Men may shave face and neck.  Do not bring valuables to the hospital.  [facility] is not responsible for any belongings or valuables.  Contacts, dentures or bridgework may not be worn into surgery.  Leave your suitcase in the car.  After surgery it may be brought to your room.  For patients admitted to the hospital, discharge time will be determined by your treatment team.  Patients discharged the day of surgery will not be allowed to drive home.   Please read over the following fact sheets that you were given. Pain Booklet, Patient Instructions for Mupirocin Application, COoghing and Deep Breathing,Surgical Site Infections, 10 Things YOU CAN DO TO  [place]

## 2018-11-21 ENCOUNTER — Other Ambulatory Visit: Payer: Self-pay

## 2018-11-21 ENCOUNTER — Encounter (HOSPITAL_COMMUNITY)
Admission: RE | Admit: 2018-11-21 | Discharge: 2018-11-21 | Disposition: A | Discharge status: Home / Self Care | Payer: Self-pay | Source: Ambulatory Visit | Attending: Student | Admitting: Student

## 2018-11-21 ENCOUNTER — Encounter (HOSPITAL_COMMUNITY): Payer: Self-pay

## 2018-11-21 DIAGNOSIS — Z01812 Encounter for preprocedural laboratory examination: Secondary | ICD-10-CM | POA: Insufficient documentation

## 2018-11-21 LAB — CBC
HCT: 43.6 % (ref 39.0–52.0)
Hemoglobin: 14.8 g/dL (ref 13.0–17.0)
MCH: 33.3 pg (ref 26.0–34.0)
MCHC: 33.9 g/dL (ref 30.0–36.0)
MCV: 98 fL (ref 80.0–100.0)
Platelets: 331 10*3/uL (ref 150–400)
RBC: 4.45 MIL/uL (ref 4.22–5.81)
RDW: 13.1 % (ref 11.5–15.5)
WBC: 16.5 10*3/uL — ABNORMAL HIGH (ref 4.0–10.5)
nRBC: 0 % (ref 0.0–0.2)

## 2018-11-21 LAB — SURGICAL PCR SCREEN
MRSA, PCR: NEGATIVE
Staphylococcus aureus: POSITIVE — AB

## 2018-11-21 NOTE — Progress Notes (Signed)
Dustin [name], [place] informed of patient's elevated WBCs 16.5 at PAT appointment.  Dr Haddix's office called [phone] to inform of abn lab result but office closed for lunch.  Will call back after 1:15pm.

## 2018-11-21 NOTE — Progress Notes (Signed)
Called Dr [employer] office, spoke with [facility].  Informed her of abn lab -WBC's elevated 16.5.  She will inform Dr Doreatha Martin.

## 2018-11-21 NOTE — Progress Notes (Signed)
Called patient in inform him of positive staph.  Mupirocin prescription called into [facility] [place] location [phone], spoke with [facility].

## 2018-11-21 NOTE — Progress Notes (Signed)
Patient informed of the [place] that is currently in effect.  Patient verbalized understanding.  Patient denies shortness of breath, fever, cough and chest pain at PAT appointment  PCP - No PCP  Cardiologist - Denies  Chest x-ray - Denies EKG - Denies Stress Test - Denies ECHO - Denies Cardiac Cath - Denies  Coronavirus Screening Have you or a family member experienced the following symptoms:  Cough yes/no: No Fever (>100.28F)  yes/no: No Runny nose yes/no: No Sore throat yes/no: No Difficulty breathing/shortness of breath  yes/no: No  Have you or a family member traveled in the last 14 days and where? yes/no: No

## 2018-11-25 ENCOUNTER — Other Ambulatory Visit
Admission: RE | Admit: 2018-11-25 | Discharge: 2018-11-25 | Disposition: A | Discharge status: Home / Self Care | Payer: HRSA Program | Source: Ambulatory Visit | Attending: Student | Admitting: Student

## 2018-11-25 ENCOUNTER — Other Ambulatory Visit: Payer: Self-pay

## 2018-11-25 DIAGNOSIS — Z1159 Encounter for screening for other viral diseases: Secondary | ICD-10-CM | POA: Insufficient documentation

## 2018-11-26 LAB — SARS CORONAVIRUS 2 (TAT 6-24 HRS): SARS Coronavirus 2: NEGATIVE

## 2018-11-28 ENCOUNTER — Ambulatory Visit (HOSPITAL_COMMUNITY): Payer: Self-pay

## 2018-11-28 ENCOUNTER — Ambulatory Visit (HOSPITAL_COMMUNITY): Payer: Self-pay | Admitting: Anesthesiology

## 2018-11-28 ENCOUNTER — Other Ambulatory Visit: Payer: Self-pay

## 2018-11-28 ENCOUNTER — Ambulatory Visit (HOSPITAL_COMMUNITY): Payer: Self-pay | Admitting: Physician Assistant

## 2018-11-28 ENCOUNTER — Encounter (HOSPITAL_COMMUNITY)
Admission: RE | Disposition: A | Discharge status: Home / Self Care | Payer: Self-pay | Source: Home / Self Care | Attending: Student

## 2018-11-28 ENCOUNTER — Encounter (HOSPITAL_COMMUNITY): Payer: Self-pay

## 2018-11-28 ENCOUNTER — Ambulatory Visit (HOSPITAL_COMMUNITY)
Admission: RE | Admit: 2018-11-28 | Discharge: 2018-11-28 | Disposition: A | Discharge status: Home / Self Care | Payer: Self-pay | Attending: Student | Admitting: Student

## 2018-11-28 DIAGNOSIS — Y831 Surgical operation with implant of artificial internal device as the cause of abnormal reaction of the patient, or of later complication, without mention of misadventure at the time of the procedure: Secondary | ICD-10-CM | POA: Insufficient documentation

## 2018-11-28 DIAGNOSIS — Z1159 Encounter for screening for other viral diseases: Secondary | ICD-10-CM | POA: Insufficient documentation

## 2018-11-28 DIAGNOSIS — T8484XA Pain due to internal orthopedic prosthetic devices, implants and grafts, initial encounter: Secondary | ICD-10-CM | POA: Insufficient documentation

## 2018-11-28 DIAGNOSIS — F17210 Nicotine dependence, cigarettes, uncomplicated: Secondary | ICD-10-CM | POA: Insufficient documentation

## 2018-11-28 DIAGNOSIS — T148XXA Other injury of unspecified body region, initial encounter: Secondary | ICD-10-CM

## 2018-11-28 DIAGNOSIS — Z419 Encounter for procedure for purposes other than remedying health state, unspecified: Secondary | ICD-10-CM

## 2018-11-28 HISTORY — PX: HARDWARE REMOVAL: SHX979

## 2018-11-28 SURGERY — REMOVAL, HARDWARE
Anesthesia: General | Laterality: Right

## 2018-11-28 MED ORDER — DEXAMETHASONE SODIUM PHOSPHATE 10 MG/ML IJ SOLN
INTRAMUSCULAR | Status: DC | PRN
  Administered 2018-11-28: 5 mg via INTRAVENOUS

## 2018-11-28 MED ORDER — CEFAZOLIN SODIUM-DEXTROSE 2-4 GM/100ML-% IV SOLN
2.0000 g | INTRAVENOUS | Status: AC
  Administered 2018-11-28: 2 g via INTRAVENOUS

## 2018-11-28 MED ORDER — FENTANYL CITRATE (PF) 250 MCG/5ML IJ SOLN
INTRAMUSCULAR | Status: DC | PRN
  Administered 2018-11-28 (×10): 50 ug via INTRAVENOUS

## 2018-11-28 MED ORDER — FENTANYL CITRATE (PF) 100 MCG/2ML IJ SOLN
25.0000 ug | INTRAMUSCULAR | Status: DC | PRN
  Administered 2018-11-28: 09:00:00 50 ug via INTRAVENOUS

## 2018-11-28 MED ORDER — PROPOFOL 10 MG/ML IV BOLUS
INTRAVENOUS | Status: AC
  Filled 2018-11-28: qty 20

## 2018-11-28 MED ORDER — LACTATED RINGERS IV SOLN
INTRAVENOUS | Status: DC
  Administered 2018-11-28 (×2): via INTRAVENOUS

## 2018-11-28 MED ORDER — POVIDONE-IODINE 10 % EX SWAB: 2.0000 "application " | Freq: Once | CUTANEOUS | Status: DC

## 2018-11-28 MED ORDER — PROMETHAZINE HCL 25 MG/ML IJ SOLN: 6.2500 mg | INTRAMUSCULAR | Status: DC | PRN

## 2018-11-28 MED ORDER — TRAMADOL HCL 50 MG PO TABS: 50.0000 mg | ORAL_TABLET | Freq: Four times a day (QID) | ORAL | 0 refills | Status: AC | PRN

## 2018-11-28 MED ORDER — FENTANYL CITRATE (PF) 250 MCG/5ML IJ SOLN
INTRAMUSCULAR | Status: AC
  Filled 2018-11-28: qty 5

## 2018-11-28 MED ORDER — 0.9 % SODIUM CHLORIDE (POUR BTL) OPTIME
TOPICAL | Status: DC | PRN
  Administered 2018-11-28: 1000 mL

## 2018-11-28 MED ORDER — ACETAMINOPHEN 325 MG PO TABS
ORAL_TABLET | ORAL | Status: AC
  Filled 2018-11-28: qty 2

## 2018-11-28 MED ORDER — DEXMEDETOMIDINE HCL IN NACL 200 MCG/50ML IV SOLN
INTRAVENOUS | Status: DC | PRN
  Administered 2018-11-28: 32 ug via INTRAVENOUS

## 2018-11-28 MED ORDER — KETOROLAC TROMETHAMINE 30 MG/ML IJ SOLN
30.0000 mg | Freq: Once | INTRAMUSCULAR | Status: AC | PRN
  Administered 2018-11-28: 09:00:00 30 mg via INTRAVENOUS

## 2018-11-28 MED ORDER — ONDANSETRON HCL 4 MG/2ML IJ SOLN
INTRAMUSCULAR | Status: DC | PRN
  Administered 2018-11-28: 4 mg via INTRAVENOUS

## 2018-11-28 MED ORDER — MIDAZOLAM HCL 2 MG/2ML IJ SOLN
INTRAMUSCULAR | Status: DC | PRN
  Administered 2018-11-28: 2 mg via INTRAVENOUS

## 2018-11-28 MED ORDER — DEXMEDETOMIDINE HCL IN NACL 200 MCG/50ML IV SOLN
INTRAVENOUS | Status: AC
  Filled 2018-11-28: qty 50

## 2018-11-28 MED ORDER — CHLORHEXIDINE GLUCONATE 4 % EX LIQD: 60.0000 mL | Freq: Once | CUTANEOUS | Status: DC

## 2018-11-28 MED ORDER — MIDAZOLAM HCL 2 MG/2ML IJ SOLN
INTRAMUSCULAR | Status: AC
  Filled 2018-11-28: qty 2

## 2018-11-28 MED ORDER — KETOROLAC TROMETHAMINE 30 MG/ML IJ SOLN
INTRAMUSCULAR | Status: AC
  Filled 2018-11-28: qty 1

## 2018-11-28 MED ORDER — FENTANYL CITRATE (PF) 100 MCG/2ML IJ SOLN
INTRAMUSCULAR | Status: AC
  Filled 2018-11-28: qty 2

## 2018-11-28 MED ORDER — LIDOCAINE 2% (20 MG/ML) 5 ML SYRINGE
INTRAMUSCULAR | Status: DC | PRN
  Administered 2018-11-28: 60 mg via INTRAVENOUS

## 2018-11-28 MED ORDER — PROPOFOL 10 MG/ML IV BOLUS
INTRAVENOUS | Status: DC | PRN
  Administered 2018-11-28: 200 mg via INTRAVENOUS

## 2018-11-28 SURGICAL SUPPLY — 73 items
APL PRP STRL LF DISP 70% ISPRP (MISCELLANEOUS) ×1
APL SKNCLS STERI-STRIP NONHPOA (GAUZE/BANDAGES/DRESSINGS) ×1
BANDAGE ACE 6X5 VEL STRL LF (GAUZE/BANDAGES/DRESSINGS) ×1 IMPLANT
BANDAGE ELASTIC 3 VELCRO ST LF (GAUZE/BANDAGES/DRESSINGS) ×2 IMPLANT
BANDAGE ESMARK 6X9 LF (GAUZE/BANDAGES/DRESSINGS) ×1 IMPLANT
BENZOIN TINCTURE PRP APPL 2/3 (GAUZE/BANDAGES/DRESSINGS) ×2 IMPLANT
BNDG CMPR 9X6 STRL LF SNTH (GAUZE/BANDAGES/DRESSINGS)
BNDG COHESIVE 6X5 TAN STRL LF (GAUZE/BANDAGES/DRESSINGS) ×1 IMPLANT
BNDG ELASTIC 4X5.8 VLCR STR LF (GAUZE/BANDAGES/DRESSINGS) ×1 IMPLANT
BNDG ESMARK 6X9 LF (GAUZE/BANDAGES/DRESSINGS)
BNDG GAUZE ELAST 4 BULKY (GAUZE/BANDAGES/DRESSINGS) ×2 IMPLANT
BRUSH SCRUB EZ PLAIN DRY (MISCELLANEOUS) ×4 IMPLANT
CHLORAPREP W/TINT 26 (MISCELLANEOUS) ×3 IMPLANT
CLOSURE STERI-STRIP 1/2X4 (GAUZE/BANDAGES/DRESSINGS) ×1
CLOSURE WOUND 1/2 X4 (GAUZE/BANDAGES/DRESSINGS)
CLSR STERI-STRIP ANTIMIC 1/2X4 (GAUZE/BANDAGES/DRESSINGS) ×1 IMPLANT
COVER SURGICAL LIGHT HANDLE (MISCELLANEOUS) ×4 IMPLANT
COVER WAND RF STERILE (DRAPES) ×1 IMPLANT
CUFF TOURN SGL QUICK 18X4 (TOURNIQUET CUFF) ×2 IMPLANT
CUFF TOURN SGL QUICK 24 (TOURNIQUET CUFF)
CUFF TOURN SGL QUICK 34 (TOURNIQUET CUFF)
CUFF TRNQT CYL 24X4X16.5-23 (TOURNIQUET CUFF) IMPLANT
CUFF TRNQT CYL 34X4.125X (TOURNIQUET CUFF) IMPLANT
DRAPE C-ARM 42X72 X-RAY (DRAPES) ×2 IMPLANT
DRAPE C-ARMOR (DRAPES) ×1 IMPLANT
DRAPE U-SHAPE 47X51 STRL (DRAPES) ×3 IMPLANT
DRSG ADAPTIC 3X8 NADH LF (GAUZE/BANDAGES/DRESSINGS) ×1 IMPLANT
ELECT REM PT RETURN 9FT ADLT (ELECTROSURGICAL) ×3
ELECTRODE REM PT RTRN 9FT ADLT (ELECTROSURGICAL) ×1 IMPLANT
GAUZE SPONGE 4X4 12PLY STRL (GAUZE/BANDAGES/DRESSINGS) ×1 IMPLANT
GAUZE SPONGE 4X4 12PLY STRL LF (GAUZE/BANDAGES/DRESSINGS) ×2 IMPLANT
GLOVE BIO SURGEON STRL SZ 6.5 (GLOVE) ×5 IMPLANT
GLOVE BIO SURGEON STRL SZ7.5 (GLOVE) ×8 IMPLANT
GLOVE BIO SURGEONS STRL SZ 6.5 (GLOVE) ×2
GLOVE BIOGEL PI IND STRL 6.5 (GLOVE) ×1 IMPLANT
GLOVE BIOGEL PI IND STRL 7.5 (GLOVE) ×1 IMPLANT
GLOVE BIOGEL PI INDICATOR 6.5 (GLOVE) ×2
GLOVE BIOGEL PI INDICATOR 7.5 (GLOVE) ×2
GOWN STRL REUS W/ TWL LRG LVL3 (GOWN DISPOSABLE) ×2 IMPLANT
GOWN STRL REUS W/TWL LRG LVL3 (GOWN DISPOSABLE) ×9
KIT BASIN OR (CUSTOM PROCEDURE TRAY) ×3 IMPLANT
KIT TURNOVER KIT B (KITS) ×3 IMPLANT
MANIFOLD NEPTUNE II (INSTRUMENTS) ×3 IMPLANT
NEEDLE 22X1 1/2 (OR ONLY) (NEEDLE) IMPLANT
NS IRRIG 1000ML POUR BTL (IV SOLUTION) ×3 IMPLANT
PACK ORTHO EXTREMITY (CUSTOM PROCEDURE TRAY) ×3 IMPLANT
PAD ARMBOARD 7.5X6 YLW CONV (MISCELLANEOUS) ×6 IMPLANT
PADDING CAST COTTON 6X4 STRL (CAST SUPPLIES) ×3 IMPLANT
PADDING CAST SYNTHETIC 2 (CAST SUPPLIES) ×2
PADDING CAST SYNTHETIC 2X4 NS (CAST SUPPLIES) IMPLANT
SPONGE LAP 18X18 RF (DISPOSABLE) ×1 IMPLANT
STAPLER VISISTAT 35W (STAPLE) IMPLANT
STOCKINETTE IMPERVIOUS LG (DRAPES) ×1 IMPLANT
STRIP CLOSURE SKIN 1/2X4 (GAUZE/BANDAGES/DRESSINGS) IMPLANT
SUCTION FRAZIER HANDLE 10FR (MISCELLANEOUS)
SUCTION TUBE FRAZIER 10FR DISP (MISCELLANEOUS) IMPLANT
SUT ETHILON 3 0 PS 1 (SUTURE) IMPLANT
SUT MNCRL AB 3-0 PS2 18 (SUTURE) ×3 IMPLANT
SUT MNCRL AB 4-0 PS2 18 (SUTURE) ×2 IMPLANT
SUT MON AB 2-0 CT1 36 (SUTURE) ×1 IMPLANT
SUT PDS AB 2-0 CT1 27 (SUTURE) IMPLANT
SUT VIC AB 0 CT1 27 (SUTURE)
SUT VIC AB 0 CT1 27XBRD ANBCTR (SUTURE) IMPLANT
SUT VIC AB 2-0 CT1 27 (SUTURE)
SUT VIC AB 2-0 CT1 TAPERPNT 27 (SUTURE) IMPLANT
SYR CONTROL 10ML LL (SYRINGE) IMPLANT
TOWEL GREEN STERILE (TOWEL DISPOSABLE) ×4 IMPLANT
TOWEL GREEN STERILE FF (TOWEL DISPOSABLE) ×4 IMPLANT
TUBE CONNECTING 12'X1/4 (SUCTIONS) ×1
TUBE CONNECTING 12X1/4 (SUCTIONS) ×2 IMPLANT
UNDERPAD 30X30 (UNDERPADS AND DIAPERS) ×3 IMPLANT
WATER STERILE IRR 1000ML POUR (IV SOLUTION) ×4 IMPLANT
YANKAUER SUCT BULB TIP NO VENT (SUCTIONS) ×1 IMPLANT

## 2018-11-28 NOTE — Transfer of Care (Signed)
Immediate Anesthesia Transfer of Care Note  Patient: Dustin [name]  Procedure(s) Performed: HARDWARE REMOVAL RIGHT THUMB (Right )  Patient Location: PACU  Anesthesia Type:General  Level of Consciousness: awake, alert  and patient cooperative  Airway & Oxygen Therapy: Patient Spontanous Breathing  Post-op Assessment: Report given to RN and Post -op Vital signs reviewed and stable  Post vital signs: Reviewed and stable  Last Vitals:  Vitals Value Taken Time  BP 132/94 11/28/18 0904  Temp    Pulse 96 11/28/18 0905  Resp 22 11/28/18 0905  SpO2 94 % 11/28/18 0905  Vitals shown include unvalidated device data.  Last Pain:  Vitals:   11/28/18 0656  TempSrc:   PainSc: 0-No pain         Complications: No apparent anesthesia complications
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
## 2018-11-28 NOTE — Discharge Instructions (Signed)
Orthopaedic Trauma Service Discharge Instructions   General Discharge Instructions  WEIGHT BEARING STATUS:Weight bearing as tolerated right hand  RANGE OF MOTION/ACTIVITY: Unrestricted motion of hand and fingers  Wound Care: Remove dressing on post-op day #2 (11/30/18).  Incisions can be left open to air if there is no drainage. If incision continues to have drainage, follow wound care instructions below. Okay to shower if no drainage from incisions.  DVT/PE prophylaxis: None  Diet: as you were eating previously.  Can use over the counter stool softeners and bowel preparations, such as Miralax, to help with bowel movements.  Narcotics can be constipating.  Be sure to drink plenty of fluids  PAIN MEDICATION USE AND EXPECTATIONS  You have likely been given narcotic medications to help control your pain.  After a traumatic event that results in an fracture (broken bone) with or without surgery, it is ok to use narcotic pain medications to help control one's pain.  We understand that everyone responds to pain differently and each individual patient will be evaluated on a regular basis for the continued need for narcotic medications. Ideally, narcotic medication use should last no more than 6-8 weeks (coinciding with fracture healing).   As a patient it is your responsibility as well to monitor narcotic medication use and report the amount and frequency you use these medications when you come to your office visit.   We would also advise that if you are using narcotic medications, you should take a dose prior to therapy to maximize you participation.  IF YOU ARE ON NARCOTIC MEDICATIONS IT IS NOT PERMISSIBLE TO OPERATE A MOTOR VEHICLE (MOTORCYCLE/CAR/TRUCK/MOPED) OR HEAVY MACHINERY DO NOT MIX NARCOTICS WITH OTHER CNS (CENTRAL NERVOUS SYSTEM) DEPRESSANTS SUCH AS ALCOHOL   STOP SMOKING OR USING NICOTINE PRODUCTS!!!!  As discussed nicotine severely impairs your body's ability to heal surgical and  traumatic wounds but also impairs bone healing.  Wounds and bone heal by forming microscopic blood vessels (angiogenesis) and nicotine is a vasoconstrictor (essentially, shrinks blood vessels).  Therefore, if vasoconstriction occurs to these microscopic blood vessels they essentially disappear and are unable to deliver necessary nutrients to the healing tissue.  This is one modifiable factor that you can do to dramatically increase your chances of healing your injury.    (This means no smoking, no nicotine gum, patches, etc)  DO NOT USE NONSTEROIDAL ANTI-INFLAMMATORY DRUGS (NSAID'S)  Using products such as Advil (ibuprofen), Aleve (naproxen), Motrin (ibuprofen) for additional pain control during fracture healing can delay and/or prevent the healing response.  If you would like to take over the counter (OTC) medication, Tylenol (acetaminophen) is ok.  However, some narcotic medications that are given for pain control contain acetaminophen as well. Therefore, you should not exceed more than 4000 mg of tylenol in a day if you do not have liver disease.  Also note that there are may OTC medicines, such as cold medicines and allergy medicines that my contain tylenol as well.  If you have any questions about medications and/or interactions please ask your doctor/PA or your pharmacist.      ICE AND ELEVATE INJURED/OPERATIVE EXTREMITY  Using ice and elevating the injured extremity above your heart can help with swelling and pain control.  Icing in a pulsatile fashion, such as 20 minutes on and 20 minutes off, can be followed.    Do not place ice directly on skin. Make sure there is a barrier between to skin and the ice pack.    Using frozen items  such as frozen peas works well as the conform nicely to the are that needs to be iced.  USE AN ACE WRAP OR TED HOSE FOR SWELLING CONTROL  In addition to icing and elevation, Ace wraps or TED hose are used to help limit and resolve swelling.  It is recommended to use  Ace wraps or TED hose until you are informed to stop.    When using Ace Wraps start the wrapping distally (farthest away from the body) and wrap proximally (closer to the body)   Example: If you had surgery on your leg or thing and you do not have a splint on, start the ace wrap at the toes and work your way up to the thigh        If you had surgery on your upper extremity and do not have a splint on, start the ace wrap at your fingers and work your way up to the upper arm  IF YOU ARE IN A SPLINT OR CAST DO NOT REMOVE IT FOR ANY REASON   If your splint gets wet for any reason please contact the office immediately. You may shower in your splint or cast as long as you keep it dry.  This can be done by wrapping in a cast cover or garbage back (or similar)  Do Not stick any thing down your splint or cast such as pencils, money, or hangers to try and scratch yourself with.  If you feel itchy take benadryl as prescribed on the bottle for itching  IF YOU ARE IN A CAM BOOT (BLACK BOOT)  You may remove boot periodically. Perform daily dressing changes as noted below.  Wash the liner of the boot regularly and wear a sock when wearing the boot. It is recommended that you sleep in the boot until told otherwise   CALL THE OFFICE WITH ANY QUESTIONS OR CONCERNS: [phone]   VISIT OUR WEBSITE FOR ADDITIONAL INFORMATION: [email or web address]      Discharge Pin Site Instructions  Dress pins daily with Kerlix roll starting on POD 2. Wrap the Kerlix so that it tamps the skin down around the pin-skin interface to prevent/limit motion of the skin relative to the pin.  (Pin-skin motion is the primary cause of pain and infection related to external fixator pin sites).  Remove any crust or coagulum that may obstruct drainage with a saline moistened gauze or soap and water.  After POD 3, if there is no discernable drainage on the pin site dressing, the interval for change can by increased to every other day.  You  may shower with the fixator, cleaning all pin sites gently with soap and water.  If you have a surgical wound this needs to be completely dry and without drainage before showering.  The extremity can be lifted by the fixator to facilitate wound care and transfers.  Notify the office/Doctor if you experience increasing drainage, redness, or pain from a pin site, or if you notice purulent (thick, snot-like) drainage.  Discharge Wound Care Instructions  Do NOT apply any ointments, solutions or lotions to pin sites or surgical wounds.  These prevent needed drainage and even though solutions like hydrogen peroxide kill bacteria, they also damage cells lining the pin sites that help fight infection.  Applying lotions or ointments can keep the wounds moist and can cause them to breakdown and open up as well. This can increase the risk for infection. When in doubt call the office.  Surgical incisions should be dressed daily.  If any drainage is noted, use one layer of adaptic, then gauze, Kerlix, and an ace wrap.  Once the incision is completely dry and without drainage, it may be left open to air out.  Showering may begin 36-48 hours later.  Cleaning gently with soap and water.  Traumatic wounds should be dressed daily as well.    One layer of adaptic, gauze, Kerlix, then ace wrap.  The adaptic can be discontinued once the draining has ceased    If you have a wet to dry dressing: wet the gauze with saline the squeeze as much saline out so the gauze is moist (not soaking wet), place moistened gauze over wound, then place a dry gauze over the moist one, followed by Kerlix wrap, then ace wrap.

## 2018-11-28 NOTE — Anesthesia Procedure Notes (Signed)
Procedure Name: LMA Insertion Date/Time: 11/28/2018 8:11 AM Performed by: Elayne Snare, CRNA Pre-anesthesia Checklist: Patient identified, Emergency Drugs available, Suction available and Patient being monitored Patient Re-evaluated:Patient Re-evaluated prior to induction Oxygen Delivery Method: Circle System Utilized Preoxygenation: Pre-oxygenation with 100% oxygen Induction Type: IV induction LMA: LMA inserted LMA Size: 4.0 Number of attempts: 1 Placement Confirmation: positive ETCO2 Tube secured with: Tape Dental Injury: Teeth and Oropharynx as per pre-operative assessment

## 2018-12-01 ENCOUNTER — Encounter (HOSPITAL_COMMUNITY): Payer: Self-pay | Admitting: Student

## 2018-12-02 NOTE — Anesthesia Postprocedure Evaluation (Signed)
Anesthesia Post Note  Patient: DAIRON PROCTER  Procedure(s) Performed: HARDWARE REMOVAL RIGHT THUMB (Right )     Patient location during evaluation: PACU Anesthesia Type: General Level of consciousness: awake and alert Pain management: pain level controlled Vital Signs Assessment: post-procedure vital signs reviewed and stable Respiratory status: spontaneous breathing, nonlabored ventilation, respiratory function stable and patient connected to nasal cannula oxygen Cardiovascular status: blood pressure returned to baseline and stable Postop Assessment: no apparent nausea or vomiting Anesthetic complications: no    Last Vitals:  Vitals:   11/28/18 0905 11/28/18 0920  BP: (!) 132/94 (!) 126/92  Pulse: (!) 101 97  Resp: 19 (!) 24  Temp:    SpO2: 93% 95%    Last Pain:  Vitals:   11/28/18 0920  TempSrc:   PainSc: [place]

## 2019-05-06 ENCOUNTER — Other Ambulatory Visit: Payer: Self-pay | Admitting: Student

## 2019-05-06 DIAGNOSIS — M79671 Pain in right foot: Secondary | ICD-10-CM

## 2019-05-11 ENCOUNTER — Other Ambulatory Visit: Payer: Self-pay | Admitting: Family Medicine

## 2019-05-13 ENCOUNTER — Ambulatory Visit
Admission: RE | Admit: 2019-05-13 | Discharge: 2019-05-13 | Disposition: A | Discharge status: Home / Self Care | Payer: Worker's Compensation | Source: Ambulatory Visit | Attending: Student | Admitting: Student

## 2019-05-13 DIAGNOSIS — M79671 Pain in right foot: Secondary | ICD-10-CM

## 2019-10-17 IMAGING — DX DG CHEST 1V PORT
1 series · 1 of 1 positions shown · non-contrast
Comparison: Yesterday.

CLINICAL DATA: Followup right chest tube for a recent pneumothorax.

EXAM:
PORTABLE CHEST 1 VIEW

[chest]
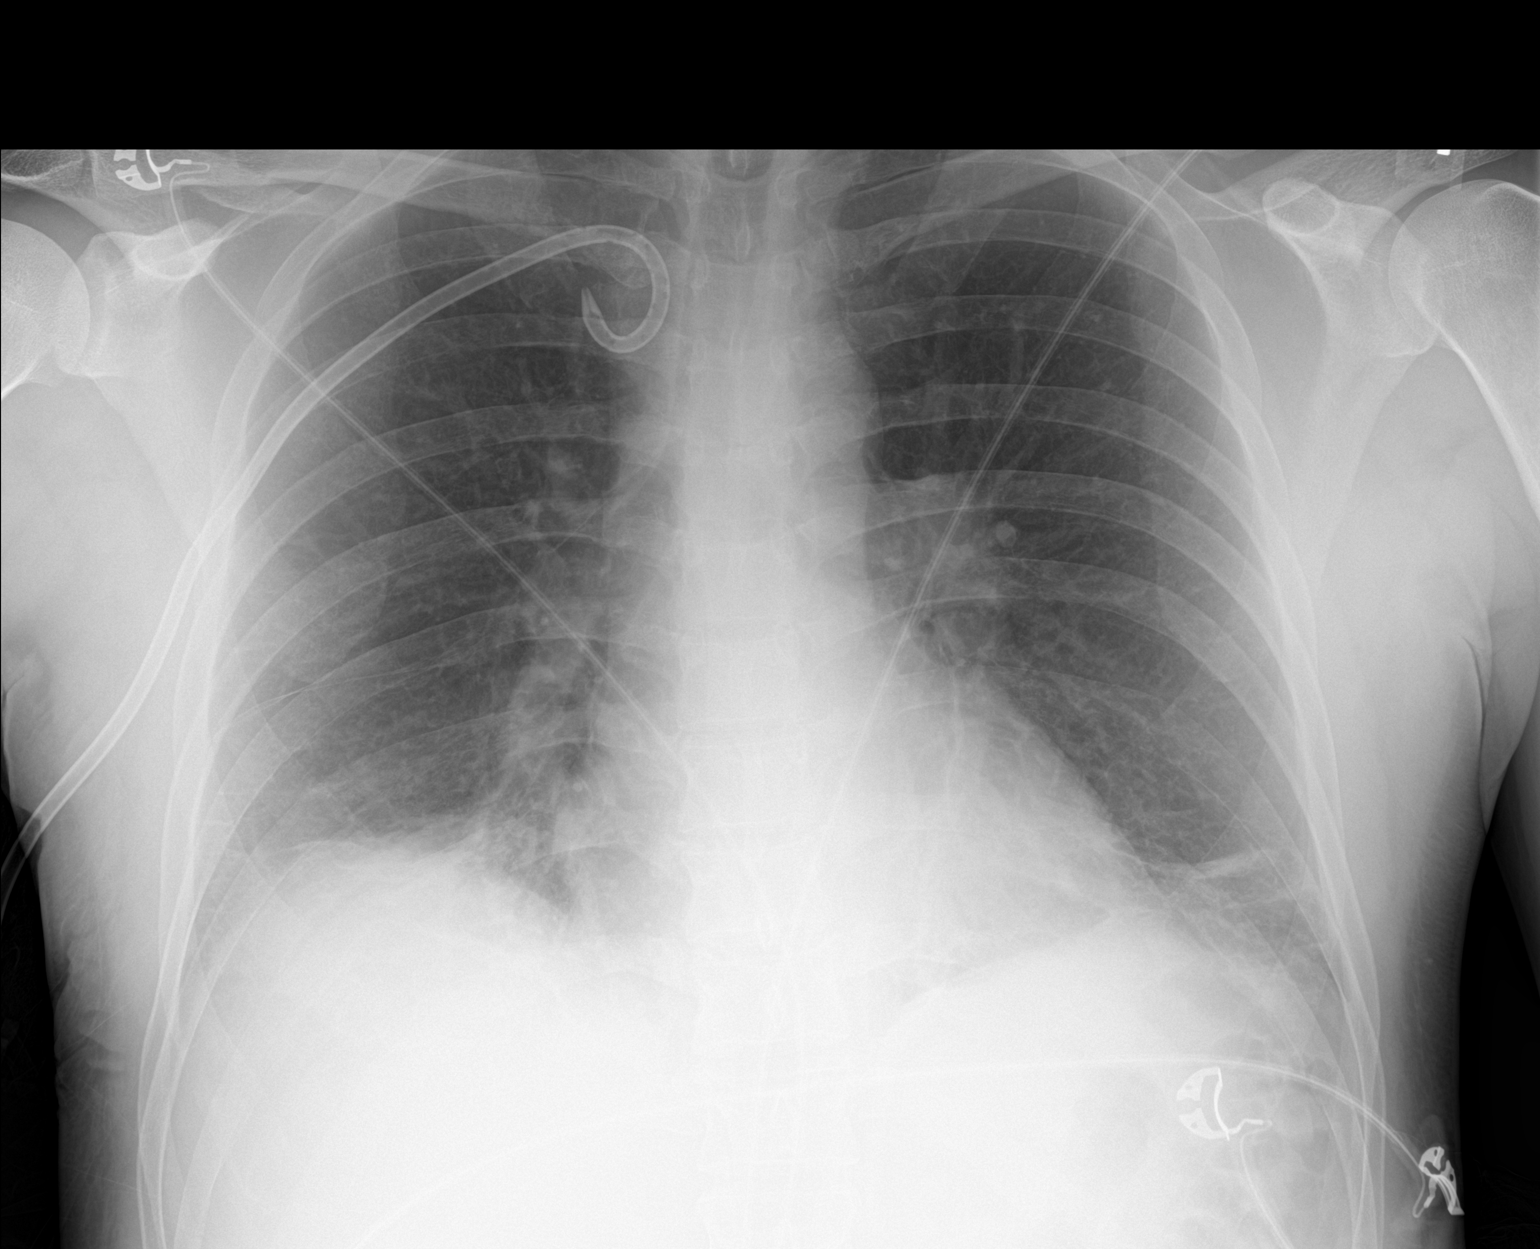

[1 of 1 positions shown; findings below may reference images not displayed]

FINDINGS: The pigtail right chest tube is unchanged. No pneumothorax is seen.
Decreased right basilar atelectasis and interval mild left basilar
atelectasis.
IMPRESSION: 1. Stable right chest tube. No pneumothorax.
2. Decreased right basilar atelectasis and interval mild left
basilar atelectasis.

## 2019-10-17 IMAGING — CT CT FOOT*R* W/O CM
3 series · 12 of 33 positions shown, 14 images · non-contrast
Comparison: [place]/[DATE]

CLINICAL DATA: Open calcaneal fracture with prior irrigation and
debridement with percutaneous fixture and wound VAC placement.

EXAM:
CT OF THE RIGHT FOOT WITHOUT CONTRAST
TECHNIQUE: Multidetector CT imaging of the right foot was performed according
to the standard protocol. Multiplanar CT image reconstructions were
also generated.

[Series 4: extremity soft tissue · axial · 0.58mm/px · z∈[-1078,-970]mm · 4 of 80 slices shown, 5 images]
[im 13/80  soft-tissue]
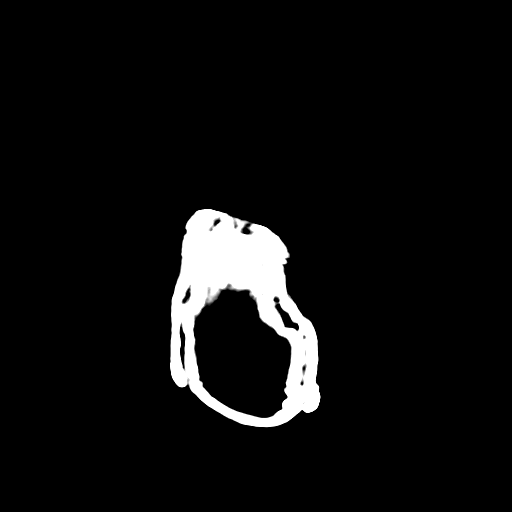
[im 13/80  bone]
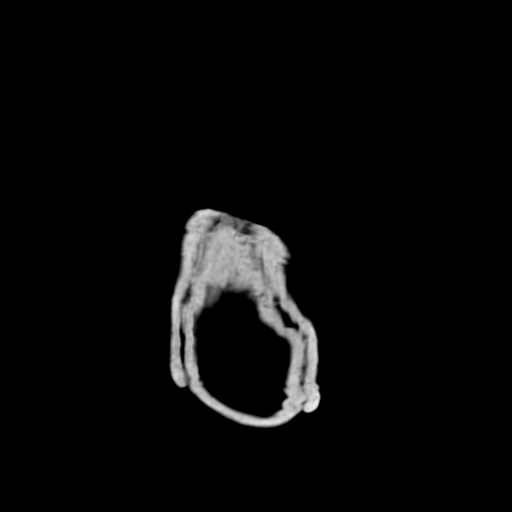
[im 31/80  bone]
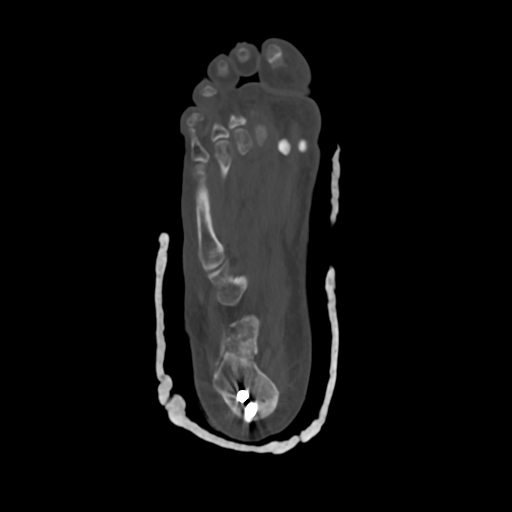
[im 49/80  bone]
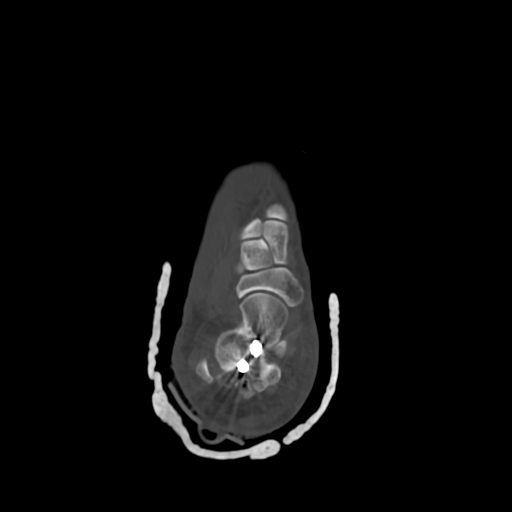
[im 67/80  bone]
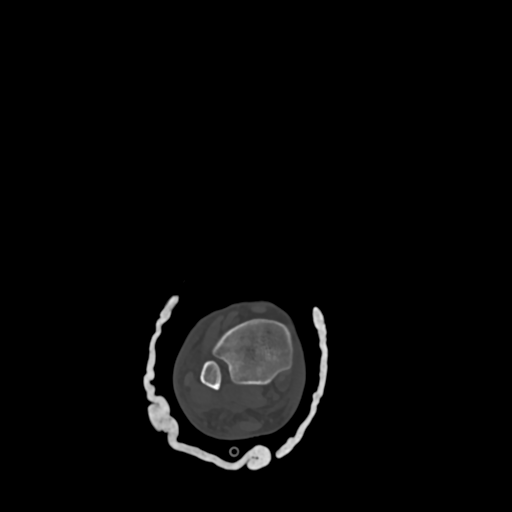

[Series 8: cor soft tissue · coronal · 0.26mm/px · 3 of 126 slices shown]
[im 26/126  bone]
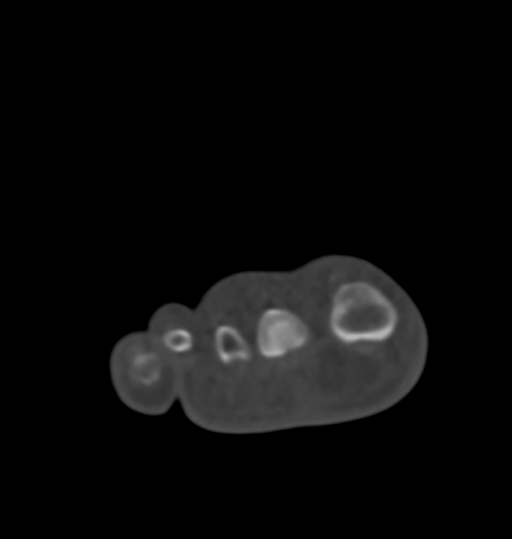
[im 51/126  bone]
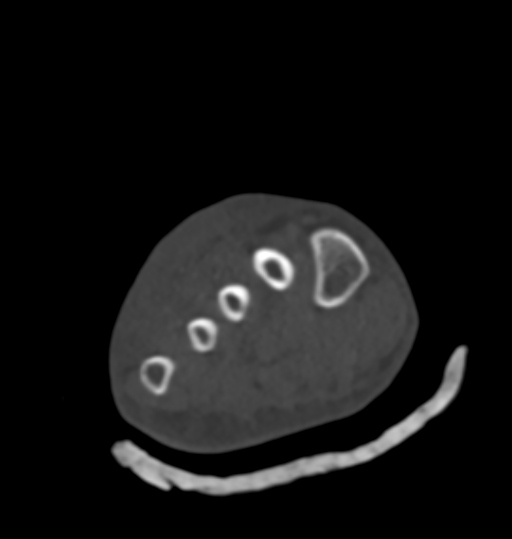
[im 76/126  bone]
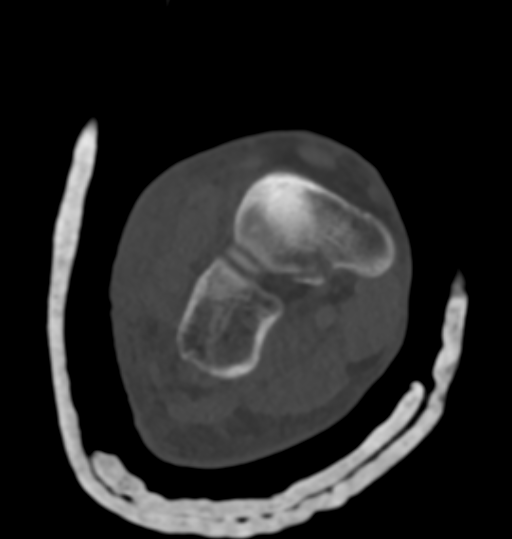

[Series 9: sag soft tissue · sagittal · 0.31mm/px · 5 of 50 slices shown, 6 images]
[im 17/50  bone]
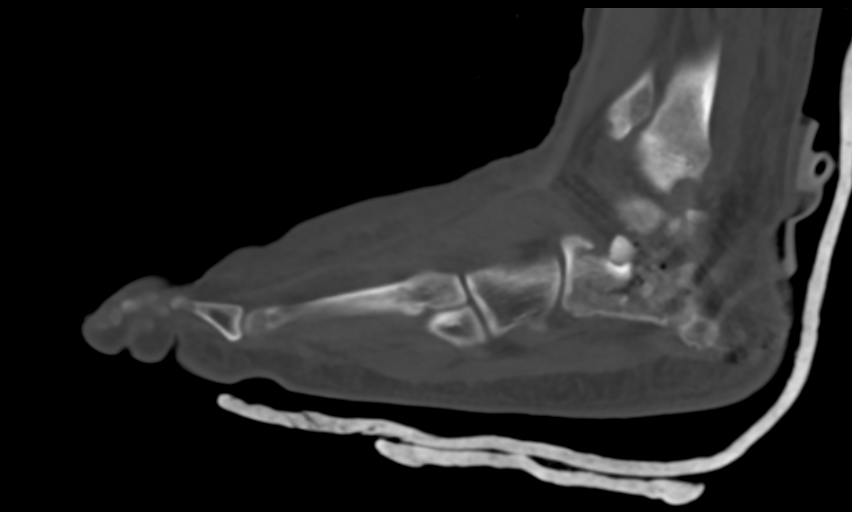
[im 21/50  bone]
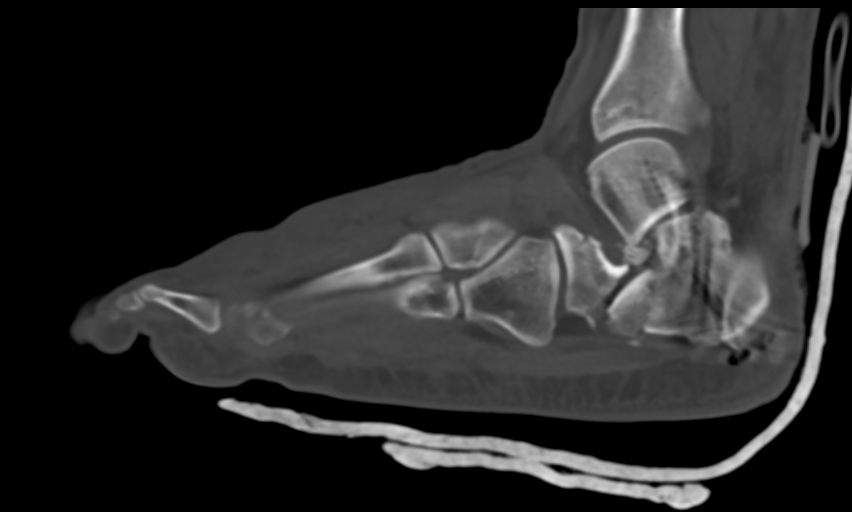
[im 25/50  soft-tissue]
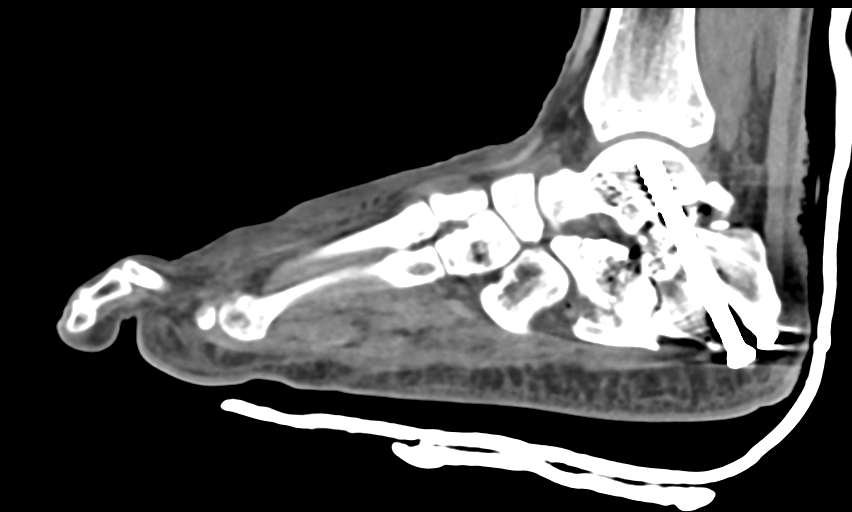
[im 25/50  bone]
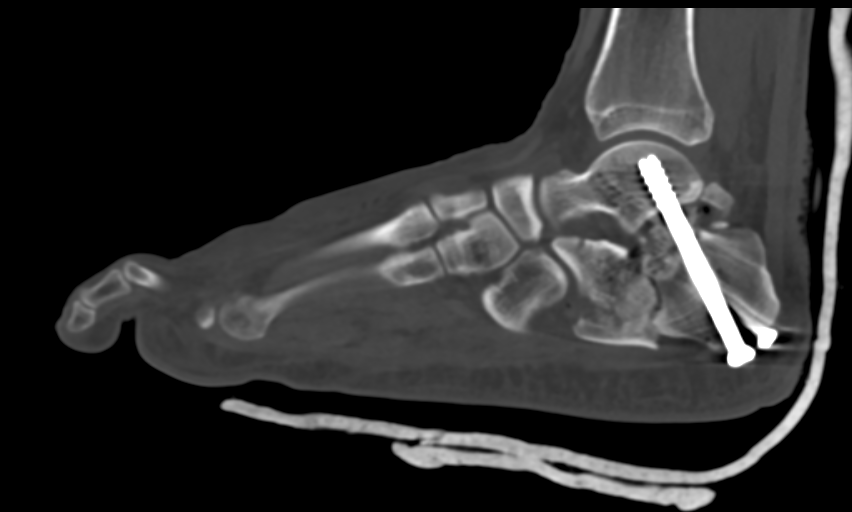
[im 29/50  bone]
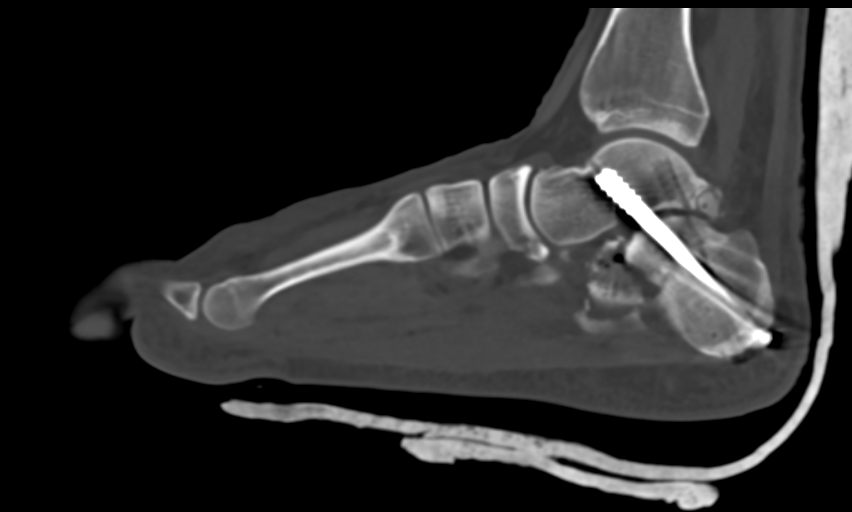
[im 33/50  bone]
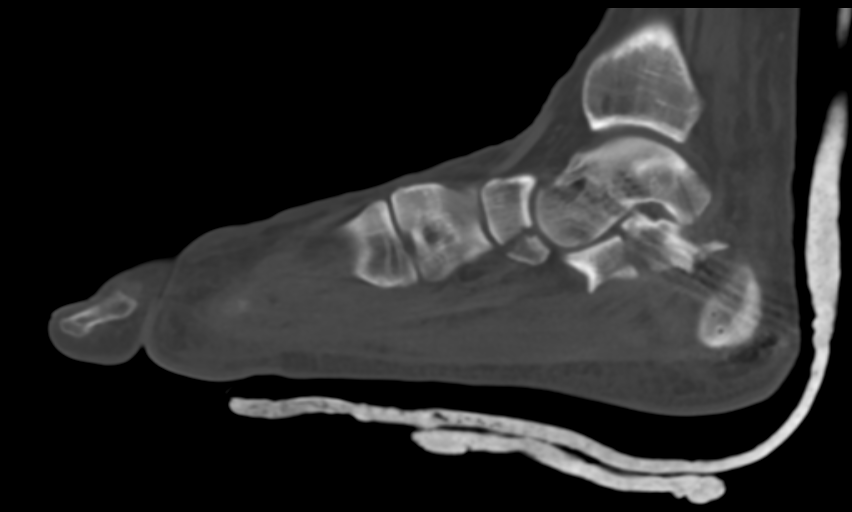

[12 of 33 positions shown; findings below may reference images not displayed]

FINDINGS: Bones/Joint/Cartilage

Percutaneous lag screw fixation across the subtalar joint noted with
a lateral lag screw extending across the posterior subtalar facet
and lagging into the talar body, and a more medial lag screw with a
slightly more oblique unless vertical orientation likewise extending
across the posterior subtalar joint oriented towards the junction of
the talar body and talar neck as shown on images 25 and 30 of series
7, respectively. Both of these traverse the dominant posterior
calcaneal fragment, as well as 1 of the intermediary fragments which
has been successfully reduced from a prior significant degree of
anterior displacement shown on the 03/17/2018 exam.

There is still some collapse of calcaneal height, with a 2.8 by
cm intermediary fragment on image 26 of series 7 involving the
calcaneal neck inferiorly displaced about 6 mm with respect to the
rest of the calcaneal fragments. Comminuted fractures of the
calcaneal neck and anterior process noted extending into the
calcaneocuboid joint, middle subtalar facet, and anterior subtalar
facet are observed. Several small intermediary fragments in this
vicinity.

There is some gas tracking along the calcaneal fracture planes
centrally. This gas is increased from 03/17/2018.

Stable fracture through the inferior navicular. Small bony fragment
along the posterolateral margin of the lateral malleolus.

Ligaments

Suboptimally assessed by CT.

Muscles and Tendons

Peroneus tendons pass anterolateral to the lateral malleolus rather
than posterior to the lateral malleolus. No current flexor tendon
entrapment.

Soft tissues

Subcutaneous edema dorsally in the forefoot and also overlying the
medial and lateral malleoli. Wound VAC noted along the
posterolateral ankle.
IMPRESSION: 1. Interval percutaneous screw fixation across the posterior
subtalar joint, with 1 of the larger intermediary calcaneal
fragments reduced into place and fixed using 2 cannulated lag
screws. Notable fragmentation in the calcaneal neck and anterior
process with 1 intermediary fragment inferiorly displaced about 6
mm.
2. There is gas along the calcaneal fracture planes which is
increased from prior, and not entirely specific for nitrogen gas
deposition versus infection.
3. Fracture of the inferior pole of the navicular, stable.
4. Small fracture of the lateral malleolus with anterior
displacement of the peroneal tendons implying retinacular
disruption.
5. Dorsal subcutaneous edema in the forefoot with edema tracking
along the medial and lateral malleoli.
6. Wound VAC in place posteriorly.
